# Patient Record
Sex: Female | Born: 1940 | Race: White | Hispanic: No | Marital: Married | Smoking: Never smoker
Health system: Southern US, Community
[De-identification: ages and names within clinical notes are randomized; demographics above are authoritative.]

## PROBLEM LIST (undated history)

## (undated) DIAGNOSIS — E871 Hypo-osmolality and hyponatremia: Secondary | ICD-10-CM

## (undated) DIAGNOSIS — G4730 Sleep apnea, unspecified: Secondary | ICD-10-CM

## (undated) DIAGNOSIS — T753XXA Motion sickness, initial encounter: Secondary | ICD-10-CM

## (undated) DIAGNOSIS — M436 Torticollis: Secondary | ICD-10-CM

## (undated) DIAGNOSIS — F329 Major depressive disorder, single episode, unspecified: Secondary | ICD-10-CM

## (undated) DIAGNOSIS — M5030 Other cervical disc degeneration, unspecified cervical region: Secondary | ICD-10-CM

## (undated) DIAGNOSIS — F419 Anxiety disorder, unspecified: Secondary | ICD-10-CM

## (undated) DIAGNOSIS — I10 Essential (primary) hypertension: Secondary | ICD-10-CM

## (undated) DIAGNOSIS — Z974 Presence of external hearing-aid: Secondary | ICD-10-CM

## (undated) DIAGNOSIS — F32A Depression, unspecified: Secondary | ICD-10-CM

## (undated) DIAGNOSIS — R51 Headache: Secondary | ICD-10-CM

## (undated) DIAGNOSIS — K219 Gastro-esophageal reflux disease without esophagitis: Secondary | ICD-10-CM

## (undated) DIAGNOSIS — E119 Type 2 diabetes mellitus without complications: Secondary | ICD-10-CM

## (undated) DIAGNOSIS — R519 Headache, unspecified: Secondary | ICD-10-CM

## (undated) DIAGNOSIS — M1990 Unspecified osteoarthritis, unspecified site: Secondary | ICD-10-CM

## (undated) HISTORY — PX: SHOULDER SURGERY: SHX246

## (undated) HISTORY — PX: REPLACEMENT TOTAL KNEE BILATERAL: SUR1225

## (undated) HISTORY — PX: CHOLECYSTECTOMY: SHX55

## (undated) HISTORY — PX: REVISION TOTAL KNEE ARTHROPLASTY: SUR1280

## (undated) HISTORY — PX: CARPAL TUNNEL RELEASE: SHX101

## (undated) HISTORY — PX: TUBAL LIGATION: SHX77

---

## 1993-11-28 HISTORY — PX: NISSEN FUNDOPLICATION: SHX2091

## 2004-12-31 ENCOUNTER — Ambulatory Visit: Payer: Self-pay | Admitting: Unknown Physician Specialty

## 2005-01-13 ENCOUNTER — Ambulatory Visit: Payer: Self-pay | Admitting: Unknown Physician Specialty

## 2005-05-06 ENCOUNTER — Ambulatory Visit: Payer: Self-pay

## 2005-08-23 ENCOUNTER — Ambulatory Visit: Payer: Self-pay | Admitting: Unknown Physician Specialty

## 2006-03-30 ENCOUNTER — Ambulatory Visit: Payer: Self-pay | Admitting: Unknown Physician Specialty

## 2006-08-22 ENCOUNTER — Ambulatory Visit: Payer: Self-pay | Admitting: Unknown Physician Specialty

## 2006-10-13 ENCOUNTER — Ambulatory Visit: Payer: Self-pay | Admitting: Unknown Physician Specialty

## 2007-04-05 ENCOUNTER — Ambulatory Visit: Payer: Self-pay | Admitting: Unknown Physician Specialty

## 2008-01-03 ENCOUNTER — Ambulatory Visit: Payer: Self-pay | Admitting: Unknown Physician Specialty

## 2008-01-27 ENCOUNTER — Ambulatory Visit: Payer: Self-pay | Admitting: Unknown Physician Specialty

## 2008-02-27 ENCOUNTER — Ambulatory Visit: Payer: Self-pay | Admitting: Unknown Physician Specialty

## 2008-04-23 ENCOUNTER — Ambulatory Visit: Payer: Self-pay | Admitting: Ophthalmology

## 2008-07-04 ENCOUNTER — Ambulatory Visit: Payer: Self-pay | Admitting: Unknown Physician Specialty

## 2009-01-14 ENCOUNTER — Ambulatory Visit: Payer: Self-pay

## 2009-06-29 ENCOUNTER — Ambulatory Visit: Payer: Self-pay | Admitting: Unknown Physician Specialty

## 2009-10-08 ENCOUNTER — Ambulatory Visit: Payer: Self-pay | Admitting: Unknown Physician Specialty

## 2010-03-09 ENCOUNTER — Ambulatory Visit: Payer: Self-pay | Admitting: Unknown Physician Specialty

## 2010-03-24 ENCOUNTER — Ambulatory Visit: Payer: Self-pay | Admitting: Unknown Physician Specialty

## 2010-11-04 ENCOUNTER — Ambulatory Visit: Payer: Self-pay | Admitting: Unknown Physician Specialty

## 2011-01-10 ENCOUNTER — Inpatient Hospital Stay: Payer: Self-pay | Admitting: Internal Medicine

## 2011-01-21 ENCOUNTER — Ambulatory Visit: Payer: Self-pay | Admitting: Unknown Physician Specialty

## 2011-05-31 ENCOUNTER — Ambulatory Visit: Payer: Self-pay | Admitting: Unknown Physician Specialty

## 2011-09-13 ENCOUNTER — Emergency Department: Payer: Self-pay | Admitting: Emergency Medicine

## 2012-01-11 ENCOUNTER — Ambulatory Visit: Payer: Self-pay | Admitting: Unknown Physician Specialty

## 2012-02-15 ENCOUNTER — Ambulatory Visit: Payer: Self-pay | Admitting: Internal Medicine

## 2012-03-05 ENCOUNTER — Ambulatory Visit: Payer: Self-pay | Admitting: Unknown Physician Specialty

## 2012-10-10 ENCOUNTER — Ambulatory Visit: Payer: Self-pay | Admitting: Internal Medicine

## 2013-02-07 ENCOUNTER — Ambulatory Visit: Payer: Self-pay | Admitting: Internal Medicine

## 2014-03-11 ENCOUNTER — Ambulatory Visit: Payer: Self-pay | Admitting: Internal Medicine

## 2015-04-25 ENCOUNTER — Encounter: Payer: Self-pay | Admitting: Emergency Medicine

## 2015-04-25 ENCOUNTER — Emergency Department
Admission: EM | Admit: 2015-04-25 | Discharge: 2015-04-25 | Disposition: A | Discharge status: Home / Self Care | Payer: Medicare Other | Attending: Emergency Medicine | Admitting: Emergency Medicine

## 2015-04-25 ENCOUNTER — Other Ambulatory Visit: Payer: Self-pay

## 2015-04-25 DIAGNOSIS — R5383 Other fatigue: Secondary | ICD-10-CM | POA: Diagnosis not present

## 2015-04-25 DIAGNOSIS — R531 Weakness: Secondary | ICD-10-CM | POA: Diagnosis not present

## 2015-04-25 DIAGNOSIS — R51 Headache: Secondary | ICD-10-CM | POA: Insufficient documentation

## 2015-04-25 DIAGNOSIS — R197 Diarrhea, unspecified: Secondary | ICD-10-CM | POA: Diagnosis not present

## 2015-04-25 DIAGNOSIS — I10 Essential (primary) hypertension: Secondary | ICD-10-CM | POA: Insufficient documentation

## 2015-04-25 DIAGNOSIS — Z880 Allergy status to penicillin: Secondary | ICD-10-CM | POA: Diagnosis not present

## 2015-04-25 DIAGNOSIS — R42 Dizziness and giddiness: Secondary | ICD-10-CM | POA: Diagnosis present

## 2015-04-25 DIAGNOSIS — E119 Type 2 diabetes mellitus without complications: Secondary | ICD-10-CM | POA: Insufficient documentation

## 2015-04-25 HISTORY — DX: Sleep apnea, unspecified: G47.30

## 2015-04-25 HISTORY — DX: Type 2 diabetes mellitus without complications: E11.9

## 2015-04-25 HISTORY — DX: Essential (primary) hypertension: I10

## 2015-04-25 HISTORY — DX: Unspecified osteoarthritis, unspecified site: M19.90

## 2015-04-25 LAB — CBC WITH DIFFERENTIAL/PLATELET
BASOS ABS: 0.1 10*3/uL (ref 0–0.1)
BASOS PCT: 1 %
EOS ABS: 0.7 10*3/uL (ref 0–0.7)
Eosinophils Relative: 9 %
HCT: 39 % (ref 35.0–47.0)
HEMOGLOBIN: 12.6 g/dL (ref 12.0–16.0)
LYMPHS ABS: 1.3 10*3/uL (ref 1.0–3.6)
Lymphocytes Relative: 16 %
MCH: 26.7 pg (ref 26.0–34.0)
MCHC: 32.2 g/dL (ref 32.0–36.0)
MCV: 82.9 fL (ref 80.0–100.0)
MONO ABS: 0.8 10*3/uL (ref 0.2–0.9)
Monocytes Relative: 10 %
NEUTROS PCT: 64 %
Neutro Abs: 5.2 10*3/uL (ref 1.4–6.5)
Platelets: 274 10*3/uL (ref 150–440)
RBC: 4.71 MIL/uL (ref 3.80–5.20)
RDW: 16.5 % — AB (ref 11.5–14.5)
WBC: 8.2 10*3/uL (ref 3.6–11.0)

## 2015-04-25 LAB — COMPREHENSIVE METABOLIC PANEL
ALT: 34 U/L (ref 14–54)
AST: 28 U/L (ref 15–41)
Albumin: 3.2 g/dL — ABNORMAL LOW (ref 3.5–5.0)
Alkaline Phosphatase: 57 U/L (ref 38–126)
Anion gap: 11 (ref 5–15)
BILIRUBIN TOTAL: 0.4 mg/dL (ref 0.3–1.2)
BUN: 20 mg/dL (ref 6–20)
CO2: 24 mmol/L (ref 22–32)
CREATININE: 0.66 mg/dL (ref 0.44–1.00)
Calcium: 9.1 mg/dL (ref 8.9–10.3)
Chloride: 103 mmol/L (ref 101–111)
GFR calc non Af Amer: >60 mL/min (ref >60)
GLUCOSE: 180 mg/dL — AB (ref 65–99)
Potassium: 4.2 mmol/L (ref 3.5–5.1)
SODIUM: 138 mmol/L (ref 135–145)
Total Protein: 6.5 g/dL (ref 6.5–8.1)

## 2015-04-25 LAB — TROPONIN I

## 2015-04-25 MED ORDER — SODIUM CHLORIDE 0.9 % IV BOLUS (SEPSIS)
1000.0000 mL | Freq: Once | INTRAVENOUS | Status: AC
  Administered 2015-04-25: 1000 mL via INTRAVENOUS

## 2015-04-25 NOTE — ED Notes (Signed)
Pt resting, lights off, no complaints at this time.

## 2015-04-25 NOTE — ED Provider Notes (Signed)
[facility] Emergency Department Provider Note  Time seen: 9:51 AM  I have reviewed the triage vital signs and the nursing notes.   HISTORY  Chief Complaint Dizziness and Headache    HPI Allison Blackburn is a 74 y.o. female with a past medical history of high blood pressure, diabetes, arthritis presents the emergency department for weakness/fatigue. According to the patient for the past 4 days she has felt increased weakness, fatigue, and intermittent headaches. Patient states 4 days ago prior to symptom onset she had a steroidal injection in her wrists for arthritis. She states since that time her blood sugars have been elevated as high as 375 (which is very abnormal for the patient), and she has been having intermittent headaches. Patient denies any focal weakness/numbness, slurred speech, or confusion. Patient denies any chest pain or shortness of breath. Denies abdominal pain, vomiting, dysuria. Patient did note this morning had small amount of diarrhea which she states is not too atypical. Patient denies any pain, but does state a mild headache at this time.    Past Medical History  Diagnosis Date  . Hypertension   . Diabetes mellitus without complication   . Arthritis   . Sleep apnea     There are no active problems to display for this patient.   Past Surgical History  Procedure Laterality Date  . Cholecystectomy    . Carpal tunnel release    . Replacement total knee bilateral      No current outpatient prescriptions on file.  Allergies Levaquin and Penicillins  No family history on file.  Social History History  Substance Use Topics  . Smoking status: Never Smoker   . Smokeless tobacco: Not on file  . Alcohol Use: Not on file    Review of Systems Constitutional: Negative for fever. Eyes: Negative for visual changes. Cardiovascular: Negative for chest pain. Respiratory: Negative for shortness of breath. Gastrointestinal: Negative for  abdominal pain, vomiting. Mild diarrhea this morning. Genitourinary: Negative for dysuria. Musculoskeletal: Negative for back pain. Neurological: Mild headache. Denies focal weakness or numbness. 10-point ROS otherwise negative.  ____________________________________________   PHYSICAL EXAM:  VITAL SIGNS: ED Triage Vitals  Enc Vitals Group     BP 04/25/15 0858 131/53 mmHg     Pulse Rate 04/25/15 0858 81     Resp 04/25/15 0858 18     Temp 04/25/15 0858 98.5 F (36.9 C)     Temp Source 04/25/15 0858 Oral     SpO2 04/25/15 0858 98 %     Weight 04/25/15 0858 177 lb (80.287 kg)     Height 04/25/15 0858 5\' 2"  (1.575 m)     Head Cir --      Peak Flow --      Pain Score 04/25/15 0909 6     Pain Loc --      Pain Edu? --      Excl. in [place]? --     Constitutional: Alert and oriented. Well appearing and in no distress. Eyes: Normal exam ENT   Head: Normocephalic and atraumatic.   Mouth/Throat: Mucous membranes are moist. Cardiovascular: Normal rate, regular rhythm. No murmurs, rubs, or gallops. Respiratory: Normal respiratory effort without tachypnea nor retractions. Breath sounds are clear  Gastrointestinal: Soft and nontender. No distention.   Musculoskeletal: Nontender with normal range of motion in all extremities. Neurologic:  Normal speech and language. No gross focal neurologic deficits Skin:  Skin is warm, dry and intact.  Psychiatric: Mood and affect are normal.  Speech and behavior are normal.   ____________________________________________    EKG  EKG reviewed and interpreted by myself shows normal sinus rhythm at 79 bpm, narrow QRS, left axis deviation, no concerning ST changes noted.  ____________________________________________   INITIAL IMPRESSION / ASSESSMENT AND PLAN / ED COURSE  Pertinent labs & imaging results that were available during my care of the patient were reviewed by me and considered in my medical decision making (see chart for  details).  74 year old with 3-4 days of weakness and intermittent headaches. We will check labs, IV hydrate, and monitor in the emergency department.   Labs are largely within normal limits. Patient states she feels much better after IV fluids and has used the bathroom several times. We will discharge home with primary care follow-up. Patient is agreeable to this plan. ____________________________________________   FINAL CLINICAL IMPRESSION(S) / ED DIAGNOSES  Headache Weakness   Harvest Dark, MD 04/25/15 1212

## 2015-04-25 NOTE — ED Notes (Signed)
Tuesday had cortisone shot , Wednesday dizziness/ headache /increased CBG , today cbg 144 , pt currently feels symptoms, less energy today.  No extremity weakness noted , no difficulty speaking,

## 2015-04-25 NOTE — Discharge Instructions (Signed)

## 2016-02-02 HISTORY — PX: WRIST SURGERY: SHX841

## 2016-02-11 NOTE — Discharge Instructions (Signed)

## 2016-02-12 ENCOUNTER — Encounter: Payer: Self-pay | Admitting: *Deleted

## 2016-02-15 ENCOUNTER — Other Ambulatory Visit: Payer: Self-pay | Admitting: Internal Medicine

## 2016-02-15 DIAGNOSIS — Z1231 Encounter for screening mammogram for malignant neoplasm of breast: Secondary | ICD-10-CM

## 2016-02-16 NOTE — Anesthesia Preprocedure Evaluation (Addendum)
Anesthesia Evaluation  Patient identified by MRN, date of birth, ID band Patient awake    Airway Mallampati: II  TM Distance: >3 FB Neck ROM: Full    Dental   Pulmonary sleep apnea ,    Pulmonary exam normal        Cardiovascular hypertension, Pt. on medications + angina Normal cardiovascular exam     Neuro/Psych  Headaches, Anxiety    GI/Hepatic GERD  Medicated,  Endo/Other  diabetes  Renal/GU      Musculoskeletal   Abdominal   Peds  Hematology   Anesthesia Other Findings   Reproductive/Obstetrics                            Anesthesia Physical Anesthesia Plan  ASA: III  Anesthesia Plan: MAC   Post-op Pain Management:    Induction: Intravenous  Airway Management Planned:   Additional Equipment:   Intra-op Plan:   Post-operative Plan:   Informed Consent: I have reviewed the patients History and Physical, chart, labs and discussed the procedure including the risks, benefits and alternatives for the proposed anesthesia with the patient or authorized representative who has indicated his/her understanding and acceptance.     Plan Discussed with: CRNA  Anesthesia Plan Comments:         Anesthesia Quick Evaluation

## 2016-02-17 ENCOUNTER — Ambulatory Visit: Payer: Medicare Other | Admitting: Anesthesiology

## 2016-02-17 ENCOUNTER — Encounter
Admission: RE | Disposition: A | Discharge status: Home / Self Care | Payer: Self-pay | Source: Ambulatory Visit | Attending: Ophthalmology

## 2016-02-17 ENCOUNTER — Ambulatory Visit
Admission: RE | Admit: 2016-02-17 | Discharge: 2016-02-17 | Disposition: A | Discharge status: Home / Self Care | Payer: Medicare Other | Source: Ambulatory Visit | Attending: Ophthalmology | Admitting: Ophthalmology

## 2016-02-17 DIAGNOSIS — K219 Gastro-esophageal reflux disease without esophagitis: Secondary | ICD-10-CM | POA: Insufficient documentation

## 2016-02-17 DIAGNOSIS — H2512 Age-related nuclear cataract, left eye: Secondary | ICD-10-CM | POA: Insufficient documentation

## 2016-02-17 DIAGNOSIS — G4730 Sleep apnea, unspecified: Secondary | ICD-10-CM | POA: Insufficient documentation

## 2016-02-17 DIAGNOSIS — M7989 Other specified soft tissue disorders: Secondary | ICD-10-CM | POA: Diagnosis not present

## 2016-02-17 DIAGNOSIS — Z888 Allergy status to other drugs, medicaments and biological substances status: Secondary | ICD-10-CM | POA: Insufficient documentation

## 2016-02-17 DIAGNOSIS — E119 Type 2 diabetes mellitus without complications: Secondary | ICD-10-CM | POA: Diagnosis not present

## 2016-02-17 DIAGNOSIS — Z880 Allergy status to penicillin: Secondary | ICD-10-CM | POA: Diagnosis not present

## 2016-02-17 DIAGNOSIS — Z9049 Acquired absence of other specified parts of digestive tract: Secondary | ICD-10-CM | POA: Diagnosis not present

## 2016-02-17 DIAGNOSIS — F419 Anxiety disorder, unspecified: Secondary | ICD-10-CM | POA: Insufficient documentation

## 2016-02-17 DIAGNOSIS — I10 Essential (primary) hypertension: Secondary | ICD-10-CM | POA: Insufficient documentation

## 2016-02-17 DIAGNOSIS — F329 Major depressive disorder, single episode, unspecified: Secondary | ICD-10-CM | POA: Diagnosis not present

## 2016-02-17 DIAGNOSIS — Z85828 Personal history of other malignant neoplasm of skin: Secondary | ICD-10-CM | POA: Insufficient documentation

## 2016-02-17 DIAGNOSIS — M1990 Unspecified osteoarthritis, unspecified site: Secondary | ICD-10-CM | POA: Insufficient documentation

## 2016-02-17 DIAGNOSIS — K5730 Diverticulosis of large intestine without perforation or abscess without bleeding: Secondary | ICD-10-CM | POA: Insufficient documentation

## 2016-02-17 DIAGNOSIS — R002 Palpitations: Secondary | ICD-10-CM | POA: Insufficient documentation

## 2016-02-17 DIAGNOSIS — H9193 Unspecified hearing loss, bilateral: Secondary | ICD-10-CM | POA: Insufficient documentation

## 2016-02-17 DIAGNOSIS — F410 Panic disorder [episodic paroxysmal anxiety] without agoraphobia: Secondary | ICD-10-CM | POA: Diagnosis not present

## 2016-02-17 DIAGNOSIS — J40 Bronchitis, not specified as acute or chronic: Secondary | ICD-10-CM | POA: Insufficient documentation

## 2016-02-17 DIAGNOSIS — Z859 Personal history of malignant neoplasm, unspecified: Secondary | ICD-10-CM | POA: Insufficient documentation

## 2016-02-17 HISTORY — DX: Motion sickness, initial encounter: T75.3XXA

## 2016-02-17 HISTORY — DX: Gastro-esophageal reflux disease without esophagitis: K21.9

## 2016-02-17 HISTORY — DX: Depression, unspecified: F32.A

## 2016-02-17 HISTORY — PX: CATARACT EXTRACTION W/PHACO: SHX586

## 2016-02-17 HISTORY — DX: Torticollis: M43.6

## 2016-02-17 HISTORY — DX: Other cervical disc degeneration, unspecified cervical region: M50.30

## 2016-02-17 HISTORY — DX: Anxiety disorder, unspecified: F41.9

## 2016-02-17 HISTORY — DX: Major depressive disorder, single episode, unspecified: F32.9

## 2016-02-17 HISTORY — DX: Headache, unspecified: R51.9

## 2016-02-17 HISTORY — DX: Hypo-osmolality and hyponatremia: E87.1

## 2016-02-17 HISTORY — DX: Presence of external hearing-aid: Z97.4

## 2016-02-17 HISTORY — DX: Headache: R51

## 2016-02-17 LAB — GLUCOSE, CAPILLARY: Glucose-Capillary: 138 mg/dL — ABNORMAL HIGH (ref 65–99)

## 2016-02-17 SURGERY — PHACOEMULSIFICATION, CATARACT, WITH IOL INSERTION
Anesthesia: Monitor Anesthesia Care | Site: Eye | Laterality: Left | Wound class: Clean

## 2016-02-17 MED ORDER — BRIMONIDINE TARTRATE 0.2 % OP SOLN
OPHTHALMIC | Status: DC | PRN
  Administered 2016-02-17: 1 [drp] via OPHTHALMIC

## 2016-02-17 MED ORDER — TIMOLOL MALEATE 0.5 % OP SOLN
OPHTHALMIC | Status: DC | PRN
  Administered 2016-02-17: 1 [drp] via OPHTHALMIC

## 2016-02-17 MED ORDER — PHENYLEPHRINE HCL 10 % OP SOLN
1.0000 [drp] | OPHTHALMIC | Status: DC | PRN
Start: 2016-02-17 — End: 2016-02-17
  Administered 2016-02-17 (×3): 1 [drp] via OPHTHALMIC

## 2016-02-17 MED ORDER — MIDAZOLAM HCL 2 MG/2ML IJ SOLN
INTRAMUSCULAR | Status: DC | PRN
  Administered 2016-02-17 (×2): 1 mg via INTRAVENOUS

## 2016-02-17 MED ORDER — TETRACAINE HCL 0.5 % OP SOLN
1.0000 [drp] | OPHTHALMIC | Status: DC | PRN
  Administered 2016-02-17: 1 [drp] via OPHTHALMIC

## 2016-02-17 MED ORDER — BSS IO SOLN
INTRAOCULAR | Status: DC | PRN
  Administered 2016-02-17: 65 mL via OPHTHALMIC

## 2016-02-17 MED ORDER — CEFUROXIME OPHTHALMIC INJECTION 1 MG/0.1 ML
INJECTION | OPHTHALMIC | Status: DC | PRN
  Administered 2016-02-17: 0.1 mL via INTRACAMERAL

## 2016-02-17 MED ORDER — POLYMYXIN B-TRIMETHOPRIM 10000-0.1 UNIT/ML-% OP SOLN
1.0000 [drp] | OPHTHALMIC | Status: DC | PRN
  Administered 2016-02-17 (×3): 1 [drp] via OPHTHALMIC

## 2016-02-17 MED ORDER — POVIDONE-IODINE 5 % OP SOLN
1.0000 "application " | OPHTHALMIC | Status: DC | PRN
  Administered 2016-02-17: 1 via OPHTHALMIC

## 2016-02-17 MED ORDER — FENTANYL CITRATE (PF) 100 MCG/2ML IJ SOLN
INTRAMUSCULAR | Status: DC | PRN
  Administered 2016-02-17: 100 ug via INTRAVENOUS

## 2016-02-17 MED ORDER — LACTATED RINGERS IV SOLN: INTRAVENOUS | Status: DC

## 2016-02-17 MED ORDER — LIDOCAINE HCL 2 % EX GEL
1.0000 | Freq: Once | CUTANEOUS | Status: AC
Start: 2016-02-17 — End: 2016-02-17
  Administered 2016-02-17: 1 via TOPICAL

## 2016-02-17 MED ORDER — NA HYALUR & NA CHOND-NA HYALUR 0.4-0.35 ML IO KIT
PACK | INTRAOCULAR | Status: DC | PRN
  Administered 2016-02-17: 1 mL via INTRAOCULAR

## 2016-02-17 MED ORDER — CYCLOPENTOLATE HCL 2 % OP SOLN
1.0000 [drp] | OPHTHALMIC | Status: DC | PRN
  Administered 2016-02-17 (×4): 1 [drp] via OPHTHALMIC

## 2016-02-17 SURGICAL SUPPLY — 29 items
CANNULA ANT/CHMB 27G (MISCELLANEOUS) ×1 IMPLANT
CANNULA ANT/CHMB 27GA (MISCELLANEOUS) ×2 IMPLANT
CARTRIDGE ABBOTT (MISCELLANEOUS) ×2 IMPLANT
GLOVE SURG LX 7.5 STRW (GLOVE) ×1
GLOVE SURG LX STRL 7.5 STRW (GLOVE) ×1 IMPLANT
GLOVE SURG TRIUMPH 8.0 PF LTX (GLOVE) ×2 IMPLANT
GOWN STRL REUS W/ TWL LRG LVL3 (GOWN DISPOSABLE) ×2 IMPLANT
GOWN STRL REUS W/TWL LRG LVL3 (GOWN DISPOSABLE) ×4
LENS IOL ACRSF IQ PC 21.0 (Intraocular Lens) IMPLANT
LENS IOL ACRYSOF IQ POST 21.0 (Intraocular Lens) ×2 IMPLANT
MARKER SKIN DUAL TIP RULER LAB (MISCELLANEOUS) ×2 IMPLANT
NDL FILTER BLUNT 18X1 1/2 (NEEDLE) ×1 IMPLANT
NDL RETROBULBAR .5 NSTRL (NEEDLE) IMPLANT
NEEDLE FILTER BLUNT 18X 1/2SAF (NEEDLE) ×1
NEEDLE FILTER BLUNT 18X1 1/2 (NEEDLE) ×1 IMPLANT
PACK CATARACT BRASINGTON (MISCELLANEOUS) ×2 IMPLANT
PACK EYE AFTER SURG (MISCELLANEOUS) ×2 IMPLANT
PACK OPTHALMIC (MISCELLANEOUS) ×2 IMPLANT
RING MALYGIN 7.0 (MISCELLANEOUS) IMPLANT
SUT ETHILON 10-0 CS-B-6CS-B-6 (SUTURE)
SUT VICRYL  9 0 (SUTURE)
SUT VICRYL 9 0 (SUTURE) IMPLANT
SUTURE EHLN 10-0 CS-B-6CS-B-6 (SUTURE) IMPLANT
SYR 3ML LL SCALE MARK (SYRINGE) ×2 IMPLANT
SYR 5ML LL (SYRINGE) IMPLANT
SYR TB 1ML LUER SLIP (SYRINGE) ×2 IMPLANT
WATER STERILE IRR 250ML POUR (IV SOLUTION) ×2 IMPLANT
WATER STERILE IRR 500ML POUR (IV SOLUTION) IMPLANT
WIPE NON LINTING 3.25X3.25 (MISCELLANEOUS) ×2 IMPLANT

## 2016-02-17 NOTE — Op Note (Signed)
OPERATIVE NOTE  MICHAEL SHAMSI [id number] 02/17/2016   PREOPERATIVE DIAGNOSIS:  Nuclear sclerotic cataract left eye. H25.12   POSTOPERATIVE DIAGNOSIS:    Nuclear sclerotic cataract left eye.     PROCEDURE:  Phacoemusification with posterior chamber intraocular lens placement of the left eye   LENS:   Implant Name Type Inv. Item Serial No. Manufacturer Lot No. LRB No. Used  IMPLANT LENS - [id number] Intraocular Lens IMPLANT LENS [id number] ALCON   Left 1     [id number] 21.0 D   ULTRASOUND TIME: 17.5  % of 1 minutes 18 seconds, CDE 13.8  SURGEON:  Wyonia Hough, MD   ANESTHESIA:  Topical with tetracaine drops and 2% Xylocaine jelly.   COMPLICATIONS:  None.   DESCRIPTION OF PROCEDURE:  The patient was identified in the holding room and transported to the operating room and placed in the supine position under the operating microscope.  The left eye was identified as the operative eye and it was prepped and draped in the usual sterile ophthalmic fashion.   A 1 millimeter clear-corneal paracentesis was made at the 1:30 position.  The anterior chamber was filled with Viscoat viscoelastic.  A 2.4 millimeter keratome was used to make a near-clear corneal incision at the 10:30 position.  .  A curvilinear capsulorrhexis was made with a cystotome and capsulorrhexis forceps.  Balanced salt solution was used to hydrodissect and hydrodelineate the nucleus.   Phacoemulsification was then used in stop and chop fashion to remove the lens nucleus and epinucleus.  The remaining cortex was then removed using the irrigation and aspiration handpiece. Provisc was then placed into the capsular bag to distend it for lens placement.  A lens was then injected into the capsular bag.  The remaining viscoelastic was aspirated.   Wounds were hydrated with balanced salt solution.  The anterior chamber was inflated to a physiologic pressure with balanced salt solution.  No wound leaks were noted. Cefuroxime 0.1  ml of a 10mg /ml solution was injected into the anterior chamber for a dose of 1 mg of intracameral antibiotic at the completion of the case.   Timolol and Brimonidine drops were applied to the eye.  The patient was taken to the recovery room in stable condition without complications of anesthesia or surgery.  Reade Trefz 02/17/2016, 8:06 AM

## 2016-02-17 NOTE — Anesthesia Postprocedure Evaluation (Signed)
Anesthesia Post Note  Patient: Allison Blackburn  Procedure(s) Performed: Procedure(s) (LRB): CATARACT EXTRACTION PHACO AND INTRAOCULAR LENS PLACEMENT (IOC) (Left)  Patient location during evaluation: PACU Anesthesia Type: General Level of consciousness: awake and alert Pain management: pain level controlled Vital Signs Assessment: post-procedure vital signs reviewed and stable Respiratory status: spontaneous breathing, nonlabored ventilation, respiratory function stable and patient connected to nasal cannula oxygen Cardiovascular status: blood pressure returned to baseline and stable Postop Assessment: no signs of nausea or vomiting Anesthetic complications: no    Marshell Levan

## 2016-02-17 NOTE — Anesthesia Procedure Notes (Signed)
Procedure Name: MAC Performed by: Kunta Hilleary Pre-anesthesia Checklist: Patient identified, Emergency Drugs available, Suction available, Timeout performed and Patient being monitored Patient Re-evaluated:Patient Re-evaluated prior to inductionOxygen Delivery Method: Nasal cannula Placement Confirmation: positive ETCO2       

## 2016-02-17 NOTE — Transfer of Care (Signed)
Immediate Anesthesia Transfer of Care Note  Patient: Allison Blackburn  Procedure(s) Performed: Procedure(s) with comments: CATARACT EXTRACTION PHACO AND INTRAOCULAR LENS PLACEMENT (IOC) (Left) - DIABETIC - oral meds CPAP  Patient Location: PACU  Anesthesia Type: MAC  Level of Consciousness: awake, alert  and patient cooperative  Airway and Oxygen Therapy: Patient Spontanous Breathing and Patient connected to supplemental oxygen  Post-op Assessment: Post-op Vital signs reviewed, Patient's Cardiovascular Status Stable, Respiratory Function Stable, Patent Airway and No signs of Nausea or vomiting  Post-op Vital Signs: Reviewed and stable  Complications: No apparent anesthesia complications

## 2016-02-17 NOTE — H&P (Signed)
  The History and Physical notes are on paper, have been signed, and are to be scanned. The patient remains stable and unchanged from the H&P.   Previous H&P reviewed, patient examined, and there are no changes.  Mickeal Daws 02/17/2016 7:37 AM

## 2016-02-18 ENCOUNTER — Encounter: Payer: Self-pay | Admitting: Ophthalmology

## 2016-03-03 ENCOUNTER — Encounter: Payer: Self-pay | Admitting: *Deleted

## 2016-03-07 NOTE — Discharge Instructions (Signed)

## 2016-03-10 ENCOUNTER — Ambulatory Visit: Payer: Medicare Other

## 2016-03-16 ENCOUNTER — Ambulatory Visit: Payer: Medicare Other

## 2016-03-17 ENCOUNTER — Ambulatory Visit
Admission: RE | Admit: 2016-03-17 | Discharge: 2016-03-17 | Disposition: A | Discharge status: Home / Self Care | Payer: Medicare Other | Source: Ambulatory Visit | Attending: Internal Medicine | Admitting: Internal Medicine

## 2016-03-17 ENCOUNTER — Other Ambulatory Visit: Payer: Self-pay | Admitting: Internal Medicine

## 2016-03-17 DIAGNOSIS — Z1231 Encounter for screening mammogram for malignant neoplasm of breast: Secondary | ICD-10-CM | POA: Diagnosis not present

## 2016-04-06 ENCOUNTER — Ambulatory Visit
Admission: RE | Admit: 2016-04-06 | Discharge: 2016-04-06 | Disposition: A | Discharge status: Home / Self Care | Payer: Medicare Other | Source: Ambulatory Visit | Attending: Ophthalmology | Admitting: Ophthalmology

## 2016-04-06 ENCOUNTER — Encounter
Admission: RE | Disposition: A | Discharge status: Home / Self Care | Payer: Self-pay | Source: Ambulatory Visit | Attending: Ophthalmology

## 2016-04-06 ENCOUNTER — Ambulatory Visit: Payer: Medicare Other | Admitting: Anesthesiology

## 2016-04-06 DIAGNOSIS — F419 Anxiety disorder, unspecified: Secondary | ICD-10-CM | POA: Insufficient documentation

## 2016-04-06 DIAGNOSIS — Z888 Allergy status to other drugs, medicaments and biological substances status: Secondary | ICD-10-CM | POA: Insufficient documentation

## 2016-04-06 DIAGNOSIS — H9193 Unspecified hearing loss, bilateral: Secondary | ICD-10-CM | POA: Insufficient documentation

## 2016-04-06 DIAGNOSIS — G4730 Sleep apnea, unspecified: Secondary | ICD-10-CM | POA: Diagnosis not present

## 2016-04-06 DIAGNOSIS — M1990 Unspecified osteoarthritis, unspecified site: Secondary | ICD-10-CM | POA: Diagnosis not present

## 2016-04-06 DIAGNOSIS — K5790 Diverticulosis of intestine, part unspecified, without perforation or abscess without bleeding: Secondary | ICD-10-CM | POA: Insufficient documentation

## 2016-04-06 DIAGNOSIS — E119 Type 2 diabetes mellitus without complications: Secondary | ICD-10-CM | POA: Insufficient documentation

## 2016-04-06 DIAGNOSIS — Z9049 Acquired absence of other specified parts of digestive tract: Secondary | ICD-10-CM | POA: Insufficient documentation

## 2016-04-06 DIAGNOSIS — K219 Gastro-esophageal reflux disease without esophagitis: Secondary | ICD-10-CM | POA: Diagnosis not present

## 2016-04-06 DIAGNOSIS — E7800 Pure hypercholesterolemia, unspecified: Secondary | ICD-10-CM | POA: Diagnosis not present

## 2016-04-06 DIAGNOSIS — Z96653 Presence of artificial knee joint, bilateral: Secondary | ICD-10-CM | POA: Insufficient documentation

## 2016-04-06 DIAGNOSIS — M810 Age-related osteoporosis without current pathological fracture: Secondary | ICD-10-CM | POA: Diagnosis not present

## 2016-04-06 DIAGNOSIS — Z9841 Cataract extraction status, right eye: Secondary | ICD-10-CM | POA: Insufficient documentation

## 2016-04-06 DIAGNOSIS — H2511 Age-related nuclear cataract, right eye: Secondary | ICD-10-CM | POA: Diagnosis not present

## 2016-04-06 DIAGNOSIS — Z85828 Personal history of other malignant neoplasm of skin: Secondary | ICD-10-CM | POA: Diagnosis not present

## 2016-04-06 DIAGNOSIS — I10 Essential (primary) hypertension: Secondary | ICD-10-CM | POA: Diagnosis not present

## 2016-04-06 HISTORY — PX: CATARACT EXTRACTION W/PHACO: SHX586

## 2016-04-06 LAB — GLUCOSE, CAPILLARY
GLUCOSE-CAPILLARY: 158 mg/dL — AB (ref 65–99)
Glucose-Capillary: 163 mg/dL — ABNORMAL HIGH (ref 65–99)

## 2016-04-06 SURGERY — PHACOEMULSIFICATION, CATARACT, WITH IOL INSERTION
Anesthesia: Monitor Anesthesia Care | Laterality: Right | Wound class: Clean

## 2016-04-06 MED ORDER — NA HYALUR & NA CHOND-NA HYALUR 0.4-0.35 ML IO KIT
PACK | INTRAOCULAR | Status: DC | PRN
  Administered 2016-04-06: 1 mL via INTRAOCULAR

## 2016-04-06 MED ORDER — POLYMYXIN B-TRIMETHOPRIM 10000-0.1 UNIT/ML-% OP SOLN
1.0000 [drp] | OPHTHALMIC | Status: DC | PRN
Start: 2016-04-06 — End: 2016-04-06
  Administered 2016-04-06 (×3): 1 [drp] via OPHTHALMIC

## 2016-04-06 MED ORDER — EPINEPHRINE HCL 1 MG/ML IJ SOLN
INTRAMUSCULAR | Status: DC | PRN
  Administered 2016-04-06: 69 mL via OPHTHALMIC

## 2016-04-06 MED ORDER — CYCLOPENTOLATE HCL 2 % OP SOLN
1.0000 [drp] | OPHTHALMIC | Status: DC | PRN
  Administered 2016-04-06 (×3): 1 [drp] via OPHTHALMIC

## 2016-04-06 MED ORDER — MIDAZOLAM HCL 2 MG/2ML IJ SOLN
INTRAMUSCULAR | Status: DC | PRN
  Administered 2016-04-06: 2 mg via INTRAVENOUS

## 2016-04-06 MED ORDER — BRIMONIDINE TARTRATE 0.2 % OP SOLN
OPHTHALMIC | Status: DC | PRN
  Administered 2016-04-06: 1 [drp] via OPHTHALMIC

## 2016-04-06 MED ORDER — TIMOLOL MALEATE 0.5 % OP SOLN
OPHTHALMIC | Status: DC | PRN
  Administered 2016-04-06: 1 [drp] via OPHTHALMIC

## 2016-04-06 MED ORDER — LIDOCAINE HCL (PF) 4 % IJ SOLN
INTRAOCULAR | Status: DC | PRN
  Administered 2016-04-06: 1 mL via OPHTHALMIC

## 2016-04-06 MED ORDER — TETRACAINE HCL 0.5 % OP SOLN
1.0000 [drp] | OPHTHALMIC | Status: DC | PRN
  Administered 2016-04-06: 1 [drp] via OPHTHALMIC

## 2016-04-06 MED ORDER — PHENYLEPHRINE HCL 10 % OP SOLN
1.0000 [drp] | OPHTHALMIC | Status: DC | PRN
  Administered 2016-04-06 (×3): 1 [drp] via OPHTHALMIC

## 2016-04-06 MED ORDER — FENTANYL CITRATE (PF) 100 MCG/2ML IJ SOLN
INTRAMUSCULAR | Status: DC | PRN
  Administered 2016-04-06 (×2): 50 ug via INTRAVENOUS

## 2016-04-06 MED ORDER — LIDOCAINE HCL 2 % EX GEL
1.0000 "application " | Freq: Once | CUTANEOUS | Status: AC
  Administered 2016-04-06: 1 via TOPICAL

## 2016-04-06 MED ORDER — CEFUROXIME OPHTHALMIC INJECTION 1 MG/0.1 ML
INJECTION | OPHTHALMIC | Status: DC | PRN
  Administered 2016-04-06: 0.1 mL via INTRACAMERAL

## 2016-04-06 MED ORDER — POVIDONE-IODINE 5 % OP SOLN
1.0000 "application " | OPHTHALMIC | Status: DC | PRN
  Administered 2016-04-06: 1 via OPHTHALMIC

## 2016-04-06 SURGICAL SUPPLY — 22 items
CANNULA ANT/CHMB 27G (MISCELLANEOUS) ×1 IMPLANT
CANNULA ANT/CHMB 27GA (MISCELLANEOUS) ×2 IMPLANT
CARTRIDGE ABBOTT (MISCELLANEOUS) IMPLANT
GLOVE SURG LX 7.5 STRW (GLOVE) ×1
GLOVE SURG LX STRL 7.5 STRW (GLOVE) ×1 IMPLANT
GLOVE SURG TRIUMPH 8.0 PF LTX (GLOVE) ×2 IMPLANT
GOWN STRL REUS W/ TWL LRG LVL3 (GOWN DISPOSABLE) ×2 IMPLANT
GOWN STRL REUS W/TWL LRG LVL3 (GOWN DISPOSABLE) ×4
LENS IOL ACRYSOF IQ 20.0 (Intraocular Lens) ×1 IMPLANT
MARKER SKIN DUAL TIP RULER LAB (MISCELLANEOUS) ×2 IMPLANT
NDL RETROBULBAR .5 NSTRL (NEEDLE) IMPLANT
PACK CATARACT BRASINGTON (MISCELLANEOUS) ×2 IMPLANT
PACK EYE AFTER SURG (MISCELLANEOUS) ×2 IMPLANT
PACK OPTHALMIC (MISCELLANEOUS) ×2 IMPLANT
RING MALYGIN 7.0 (MISCELLANEOUS) IMPLANT
SUT ETHILON 10-0 CS-B-6CS-B-6 (SUTURE)
SUT VICRYL  9 0 (SUTURE)
SUT VICRYL 9 0 (SUTURE) IMPLANT
SUTURE EHLN 10-0 CS-B-6CS-B-6 (SUTURE) IMPLANT
SYR TB 1ML LUER SLIP (SYRINGE) ×2 IMPLANT
WATER STERILE IRR 250ML POUR (IV SOLUTION) ×2 IMPLANT
WIPE NON LINTING 3.25X3.25 (MISCELLANEOUS) ×2 IMPLANT

## 2016-04-06 NOTE — Anesthesia Postprocedure Evaluation (Signed)
Anesthesia Post Note  Patient: Allison Blackburn  Procedure(s) Performed: Procedure(s) (LRB): CATARACT EXTRACTION PHACO AND INTRAOCULAR LENS PLACEMENT (IOC) right eye (Right)  Patient location during evaluation: PACU Anesthesia Type: MAC Level of consciousness: awake and alert Pain management: pain level controlled Vital Signs Assessment: post-procedure vital signs reviewed and stable Respiratory status: spontaneous breathing, nonlabored ventilation, respiratory function stable and patient connected to nasal cannula oxygen Cardiovascular status: blood pressure returned to baseline and stable Postop Assessment: no signs of nausea or vomiting Anesthetic complications: no    Alisa Graff

## 2016-04-06 NOTE — Anesthesia Preprocedure Evaluation (Signed)
Anesthesia Evaluation  Patient identified by MRN, date of birth, ID band Patient awake    Reviewed: Allergy & Precautions, H&P , NPO status , Patient's Chart, lab work & pertinent test results, reviewed documented beta blocker date and time   Airway Mallampati: II  TM Distance: >3 FB Neck ROM: full    Dental no notable dental hx.    Pulmonary sleep apnea ,    Pulmonary exam normal breath sounds clear to auscultation       Cardiovascular Exercise Tolerance: Good hypertension,  Rhythm:regular Rate:Normal     Neuro/Psych  Headaches, negative psych ROS   GI/Hepatic Neg liver ROS, GERD  ,  Endo/Other  negative endocrine ROSdiabetes  Renal/GU negative Renal ROS  negative genitourinary   Musculoskeletal   Abdominal   Peds  Hematology negative hematology ROS (+)   Anesthesia Other Findings   Reproductive/Obstetrics negative OB ROS                             Anesthesia Physical Anesthesia Plan  ASA: III  Anesthesia Plan: MAC   Post-op Pain Management:    Induction:   Airway Management Planned:   Additional Equipment:   Intra-op Plan:   Post-operative Plan:   Informed Consent: I have reviewed the patients History and Physical, chart, labs and discussed the procedure including the risks, benefits and alternatives for the proposed anesthesia with the patient or authorized representative who has indicated his/her understanding and acceptance.   Dental Advisory Given  Plan Discussed with: CRNA  Anesthesia Plan Comments:         Anesthesia Quick Evaluation

## 2016-04-06 NOTE — H&P (Signed)
  The History and Physical notes are on paper, have been signed, and are to be scanned. The patient remains stable and unchanged from the H&P.   Previous H&P reviewed, patient examined, and there are no changes.  Allison Blackburn 04/06/2016 7:40 AM

## 2016-04-06 NOTE — Anesthesia Procedure Notes (Signed)
Procedure Name: MAC Performed by: Kaiana Marion Pre-anesthesia Checklist: Patient identified, Emergency Drugs available, Suction available, Timeout performed and Patient being monitored Patient Re-evaluated:Patient Re-evaluated prior to inductionOxygen Delivery Method: Nasal cannula Placement Confirmation: positive ETCO2     

## 2016-04-06 NOTE — Transfer of Care (Signed)
Immediate Anesthesia Transfer of Care Note  Patient: Allison Blackburn  Procedure(s) Performed: Procedure(s) with comments: CATARACT EXTRACTION PHACO AND INTRAOCULAR LENS PLACEMENT (IOC) right eye (Right) - DIABETIC - oral meds CPAP  Patient Location: PACU  Anesthesia Type: MAC  Level of Consciousness: awake, alert  and patient cooperative  Airway and Oxygen Therapy: Patient Spontanous Breathing and Patient connected to supplemental oxygen  Post-op Assessment: Post-op Vital signs reviewed, Patient's Cardiovascular Status Stable, Respiratory Function Stable, Patent Airway and No signs of Nausea or vomiting  Post-op Vital Signs: Reviewed and stable  Complications: No apparent anesthesia complications

## 2016-04-06 NOTE — Op Note (Signed)
LOCATION:  [facility]   PREOPERATIVE DIAGNOSIS:    Nuclear sclerotic cataract right eye. H25.11   POSTOPERATIVE DIAGNOSIS:  Nuclear sclerotic cataract right eye.     PROCEDURE:  Phacoemusification with posterior chamber intraocular lens placement of the right eye   LENS:   Implant Name Type Inv. Item Serial No. Manufacturer Lot No. LRB No. Used  LENS IOL ACRYSOF IQ 20.0 - [id number] Intraocular Lens LENS IOL ACRYSOF IQ 20.0 [id number] [employer]CON   Right 1        ULTRASOUND TIME: 17 % of 1 minutes, 10 seconds.  CDE 12.1   SURGEON:  Wyonia Hough, MD   ANESTHESIA:  Topical with tetracaine drops and 2% Xylocaine jelly, augmented with 1% preservative-free intracameral lidocaine.    COMPLICATIONS:  None.   DESCRIPTION OF PROCEDURE:  The patient was identified in the holding room and transported to the operating room and placed in the supine position under the operating microscope.  The right eye was identified as the operative eye and it was prepped and draped in the usual sterile ophthalmic fashion.   A 1 millimeter clear-corneal paracentesis was made at the 12:00 position.  0.5 ml of preservative-free 1% lidocaine was injected into the anterior chamber. The anterior chamber was filled with Viscoat viscoelastic.  A 2.4 millimeter keratome was used to make a near-clear corneal incision at the 9:00 position.  A curvilinear capsulorrhexis was made with a cystotome and capsulorrhexis forceps.  Balanced salt solution was used to hydrodissect and hydrodelineate the nucleus.   Phacoemulsification was then used in stop and chop fashion to remove the lens nucleus and epinucleus.  The remaining cortex was then removed using the irrigation and aspiration handpiece. Provisc was then placed into the capsular bag to distend it for lens placement.  A lens was then injected into the capsular bag.  The remaining viscoelastic was aspirated.   Wounds were hydrated with balanced salt solution.   The anterior chamber was inflated to a physiologic pressure with balanced salt solution.  No wound leaks were noted. Cefuroxime 0.1 ml of a 10mg /ml solution was injected into the anterior chamber for a dose of 1 mg of intracameral antibiotic at the completion of the case.   Timolol and Brimonidine drops were applied to the eye.  The patient was taken to the recovery room in stable condition without complications of anesthesia or surgery.   Juliann Olesky 04/06/2016, 8:11 AM

## 2016-04-07 ENCOUNTER — Encounter: Payer: Self-pay | Admitting: Ophthalmology

## 2016-04-15 ENCOUNTER — Encounter: Payer: Medicare Other | Admitting: Physical Therapy

## 2016-04-18 ENCOUNTER — Telehealth: Payer: Self-pay | Admitting: Neurology

## 2016-04-18 ENCOUNTER — Ambulatory Visit: Payer: Medicare Other | Admitting: Neurology

## 2016-04-18 NOTE — Telephone Encounter (Signed)
Patient called my cell phone. No idea how she got it. Said she needed an appointment with me and our phone lines were not working. Can you call patient and find out what is going on? Doesn't appear I have ever seen her before, she is a new referral. Also let her know not to use my cell phone, that is my personal phone line and I do not check it often and may miss something important. Thanks.

## 2016-04-18 NOTE — Telephone Encounter (Signed)
Allison Blackburn- can you look into this? Looks like we were sent a referral back on 04/05/16 but appt got cx? She would be a new patient. Just when the phone start working again, thank you!

## 2016-04-18 NOTE — Telephone Encounter (Signed)
Dr Jaynee Eagles- [facility] Per new pt coordinator: "Called pt and sched for 04/22/16 and pt is aware not to call Dr. Jaynee Eagles cell # for any appointments in the future. I am not sure how she gor that #"

## 2016-04-22 ENCOUNTER — Ambulatory Visit: Payer: Medicare Other | Admitting: Physical Therapy

## 2016-04-22 ENCOUNTER — Ambulatory Visit (INDEPENDENT_AMBULATORY_CARE_PROVIDER_SITE_OTHER): Payer: Medicare Other | Admitting: Neurology

## 2016-04-22 ENCOUNTER — Encounter: Payer: Self-pay | Admitting: Neurology

## 2016-04-22 VITALS — BP 141/69 | HR 84 | Ht 60.5 in | Wt 179.4 lb

## 2016-04-22 DIAGNOSIS — F411 Generalized anxiety disorder: Secondary | ICD-10-CM

## 2016-04-22 DIAGNOSIS — I10 Essential (primary) hypertension: Secondary | ICD-10-CM

## 2016-04-22 DIAGNOSIS — G2581 Restless legs syndrome: Secondary | ICD-10-CM

## 2016-04-22 DIAGNOSIS — F32A Depression, unspecified: Secondary | ICD-10-CM

## 2016-04-22 DIAGNOSIS — R42 Dizziness and giddiness: Secondary | ICD-10-CM | POA: Diagnosis not present

## 2016-04-22 DIAGNOSIS — G4733 Obstructive sleep apnea (adult) (pediatric): Secondary | ICD-10-CM | POA: Diagnosis not present

## 2016-04-22 DIAGNOSIS — F329 Major depressive disorder, single episode, unspecified: Secondary | ICD-10-CM | POA: Diagnosis not present

## 2016-04-22 MED ORDER — PREGABALIN 150 MG PO CAPS: 150.0000 mg | ORAL_CAPSULE | Freq: Every day | ORAL | Status: DC

## 2016-04-22 NOTE — Progress Notes (Signed)
[facility]    Provider:  Dr Jaynee Eagles Referring Provider: Rusty Aus, MD Primary Care Physician:  Rusty Aus, MD  CC:  Dizziness  HPI:  Allison Blackburn is a 75 y.o. female here as a referral from Dr. Sabra Heck for "feeling woozy". PNHx anxiety, depression, HTN, hypersomnia with sleep apnea, obesity, neuropathy, RLS, DM2, migraines. She feels like she is on a rocking boat. She also has symptoms when sitting but not as bad as when walking. Not just when walking. A "woozy" feeling. Hand surgery on March 7th, Cataract surgery on 22nd, started feeling woozy on the 26th and 27th,  She was on hydrocodone for 3 weeks, on the 25th she stopped the hydrocone. She has been feeling woozy since then. She saw her regular doctor the day after Easter and she saw an ENT. Second catract surgery on May 10th. Last Monday she cut back on the mirapex, she thinks she overdosed on it. She feels better. But she still feels "woozy" now but better since decreasing her dopamine agonist for RLS. She is compliant on her cpap. She has had a headache lately. She is under extreme stress because her son has prostate cancer and the marriage is a mess and her grandson is in trouble. Steroids did not help with her symptoms. 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 She is only taking one Klonopin, trazodone and mirapex at night. She is also on Cymbalta. Discussed polypharmacy, stress. No other focal neurologic deficits.   Reviewed notes, labs and imaging from outside physicians, which showed:  Reviewed [facility] visits and records and on April 7th 2017 she was seen and evaluated for her dizziness and diagnosis was "Medicine withdrawal-combination of coming off Klonopin to a lower dose plus discontinuing hydrocodone. With persistent symptoms go back on hydrocodone half tab twice daily 7 days, half tab every morning 7 days, quarter tab 2 AM 7 days then off, maintain Klonopin 2 at bedtime"  Patient had an MRI in the ED  at [facility] in 02/2016 and "MRI/MRA of brain/neck unremarkable."  MRI of the brain 04/2005: reviewed report: Multiplanar images were obtained through the brain both prior to and following administration of gadolinium. The diffusion-weighted sequences exhibit no objective evidence of acute ischemic change. The inversion recovery-FLAIR sequences reveal small amounts of increased periventricular white matter signal anteriorly in the region of the extreme capsule bilaterally. These areas of increased signal have become slightly more conspicuous and increased in number since the prior study. Elsewhere in the deep white matter of both cerebral hemispheres there are several punctate areas of increased white matter signal consistent with chronic small vessel  ischemic-type change. The standard spin-echo images reveal the ventricles to be normal in size and position. Following gadolinium administration, the enhancement pattern of the brain parenchyma is normal. The 7th and 8th cranial nerves appear normal at the level of the cerebellopontine angles. I see no abnormal intra or extraaxial fluid collections on the T2-weighted images. The sella and parasellar structures are normal in appearance.   IMPRESSION:   Since the prior study there has been slight progression of the white matter signal increase in a punctate fashion in both cerebral hemispheres. I do not see evidence of an intracranial mass, hydrocephalus, or an acute ischemic or hemorrhagic event.   I do not see evidence of significant inflammatory change in the visualized portions of the paranasal sinuses or of the mastoid air cells.   Hgba1c 7.6, labs 12/2015 cbc with creatinine 0.7,  triglyceride 375, ldl 45, tsh 2.138  Review of Systems: Patient complains of symptoms per HPI as well as the following symptoms: fatigue, dizziness, depression, anxiety, ringing in ears, aching muscles, fatigue. Pertinent negatives per HPI.  All others negative.   Social History   Social History  . Marital Status: Married    Spouse Name: N/A  . Number of Children: 2  . Years of Education: 20   Occupational History  . Not on file.   Social History Main Topics  . Smoking status: Never Smoker   . Smokeless tobacco: Not on file  . Alcohol Use: No  . Drug Use: Not on file  . Sexual Activity: Not on file   Other Topics Concern  . Not on file   Social History Narrative   Lives with husband.   Caffeine use:  2 cups coffee per day       Family History  Problem Relation Age of Onset  . Stroke Neg Hx   . Seizures Neg Hx   . Neuropathy Neg Hx   . Parkinsonism Neg Hx   . Migraines Neg Hx   . Ataxia Neg Hx   . Dementia Neg Hx     Past Medical History  Diagnosis Date  . Hypertension   . Diabetes mellitus without complication ([place])   . Arthritis   . Sleep apnea     CPAP  . GERD (gastroesophageal reflux disease)     in past.  corrected with [employer].  Marland Kitchen Headache     migraines in past.  now some from C5-C7 disc degeneration  . Stiff neck     degen disc C5-C7.  Limited right/left turn.  OK up/down motion.  . Degenerative disc disease, cervical     C5-C7  . Motion sickness     cars/mountains  . Wears hearing aid   . Depression   . Anxiety   . Sodium (Na) deficiency     had "drip" during knee surg.  Sodium levels very low for extended time after.  OK now    Past Surgical History  Procedure Laterality Date  . Cholecystectomy    . Carpal tunnel release    . Replacement total knee bilateral    . Revision total knee arthroplasty Bilateral     after fall  . Shoulder surgery Right     x2  . Tubal ligation    . Wrist surgery Right 02/02/16    [facility]  . Nissen fundoplication  [id number]  . Cataract extraction w/phaco Left 02/17/2016    Procedure: CATARACT EXTRACTION PHACO AND INTRAOCULAR LENS PLACEMENT (IOC);  Surgeon: Leandrew Koyanagi, MD;  Location: [facility];  Service: Ophthalmology;  Laterality:  Left;  DIABETIC - oral meds CPAP  . Cataract extraction w/phaco Right 04/06/2016    Procedure: CATARACT EXTRACTION PHACO AND INTRAOCULAR LENS PLACEMENT (IOC) right eye;  Surgeon: Leandrew Koyanagi, MD;  Location: [place];  Service: Ophthalmology;  Laterality: Right;  DIABETIC - oral meds CPAP    Current Outpatient Prescriptions  Medication Sig Dispense Refill  . amLODipine (NORVASC) 10 MG tablet Take 10 mg by mouth daily. Reported on 04/22/2016    . aspirin 81 MG tablet Take 81 mg by mouth daily.    Marland Kitchen atorvastatin (LIPITOR) 10 MG tablet Take 10 mg by mouth 3 (three) times a week.    . Calcium Citrate-Vitamin D (CALCIUM CITRATE + PO) Take 2 tablets by mouth daily.    . celecoxib (CELEBREX) 200 MG capsule Take 200 mg  by mouth daily. AM    . Cholecalciferol (VITAMIN D-3) 1000 units CAPS Take by mouth.    . clonazePAM (KLONOPIN) 0.5 MG tablet Take 1 mg by mouth at bedtime.    . DULoxetine (CYMBALTA) 30 MG capsule Take 30 mg by mouth daily.    Marland Kitchen glimepiride (AMARYL) 1 MG tablet Take 1 mg by mouth QID. 1 tab, 3 times daily.  2 tabs at night (if sugar is high).    . losartan (COZAAR) 50 MG tablet Take 50 mg by mouth daily.    . magnesium oxide (MAG-OX) 400 MG tablet Take 400 mg by mouth 2 (two) times daily.    . metFORMIN (GLUCOPHAGE) 850 MG tablet Take 850 mg by mouth 3 (three) times daily.    . metoprolol (LOPRESSOR) 50 MG tablet Take 50 mg by mouth daily.    . Multiple Vitamins-Minerals (CENTRUM SILVER PO) Take 1 tablet by mouth daily.    . Omega-3 Fatty Acids (FISH OIL) 1200 MG CAPS Take by mouth daily. PM    . Potassium Gluconate 2.5 MEQ TABS Take by mouth.    . pramipexole (MIRAPEX) 0.5 MG tablet Take 0.5 mg by mouth 2 (two) times daily. 2 tabs in afternoon, 2 tabs at night    . traZODone (DESYREL) 100 MG tablet Take 100 mg by mouth at bedtime.    . gabapentin (NEURONTIN) 300 MG capsule One capsule at noon, 2 capsules at night 45 capsule 0  . Liraglutide (VICTOZA) 18 MG/3ML  SOPN Inject 7.2 mg into the skin daily. Reported on 04/22/2016     No current facility-administered medications for this visit.    Allergies as of 04/22/2016 - Review Complete 04/22/2016  Allergen Reaction Noted  . Levaquin [levofloxacin in d5w] Other (See Comments) 04/25/2015  . Lyrica [pregabalin]  02/12/2016  . Neurontin [gabapentin]  02/12/2016  . Penicillins Rash 04/25/2015    Vitals: BP 141/69 mmHg  Pulse 84  Ht 5' 0.5" (1.537 m)  Wt 179 lb 6.4 oz (81.375 kg)  BMI 34.45 kg/m2  SpO2 95% Last Weight:  Wt Readings from Last 1 Encounters:  04/22/16 179 lb 6.4 oz (81.375 kg)   Last Height:   Ht Readings from Last 1 Encounters:  04/22/16 5' 0.5" (1.537 m)    Physical exam: Exam: Gen: NAD, conversant, well nourised, obese, well groomed                     CV: RRR, no MRG. No Carotid Bruits. No peripheral edema, warm, nontender Eyes: Conjunctivae clear without exudates or hemorrhage  Neuro: Detailed Neurologic Exam  Speech:    Speech is normal; fluent and spontaneous with normal comprehension.  Cognition:    The patient is oriented to person, place, and time;     recent and remote memory intact;     language fluent;     normal attention, concentration,     fund of knowledge Cranial Nerves:    The pupils are equal, round, and reactive to light. The fundi are normal and spontaneous venous pulsations are present. Visual fields are full to finger confrontation. Extraocular movements are intact. Trigeminal sensation is intact and the muscles of mastication are normal. The face is symmetric. The palate elevates in the midline. Hearing intact. Voice is normal. Shoulder shrug is normal. The tongue has normal motion without fasciculations.   Coordination:    Normal finger to nose and heel to shin.   Gait:    No dysmetria  Motor Observation:  No asymmetry, no atrophy, and no involuntary movements noted. Tone:    Normal muscle tone.    Posture:    Posture is normal.  normal erect    Strength:    Strength is V/V in the upper and lower limbs.      Sensation: intact to LT     Reflex Exam:  DTR's: Absent AJs otherwise deep tendon reflexes in the upper and lower extremities are normal bilaterally.   Toes:    The toes are downgoing bilaterally.   Clonus:    Clonus is absent.      Assessment/Plan:   75 y.o. female here as a referral from Dr. Sabra Heck for "feeling woozy". PMHx anxiety, depression, HTN, hypersomnia with sleep apnea, obesity, neuropathy, RLS, DM2, migraines. She feels like she is on a rocking boat. She also has symptoms when sitting but not as bad as when walking. Evaluated at [facility] in the ED and MRI of the brain unremarkable per review of notes. Orthostatics were nml, no orthostatic hypotension in the office today. Likely medication related. She was taking a high dose of dopamine agonist for RLS by mistake, feels better with reduction.   Remember to drink plenty of fluid, eat healthy meals and do not skip any meals. Try to eat protein with a every meal and eat a healthy snack such as fruit or nuts in between meals. Try to keep a regular sleep-wake schedule and try to exercise daily, particularly in the form of walking, 20-30 minutes a day, if you can.   Dizziness: will decrease Mirapex and try Lyrica instead As far as your medications are concerned, I would like to suggest: Take 1-2 pills Lyrica before nap and before bed. Go down 1/2 pill of Mirapex twice a day for 2-3 days then 1/2 pill at night for 2-3 days.  RLS: As far as diagnostic testing: Lab today, Check Ferritin as it is associated with RLS.   May also consider Vestibular migraines as an etiology  To prevent or relieve headaches, try the following: Cool Compress. Lie down and place a cool compress on your head.  Avoid headache triggers. If certain foods or odors seem to have triggered your migraines in the past, avoid them. A headache diary might help you identify triggers.  Include  physical activity in your daily routine. Try a daily walk or other moderate aerobic exercise.  Manage stress. Find healthy ways to cope with the stressors, such as delegating tasks on your to-do list.  Practice relaxation techniques. Try deep breathing, yoga, massage and visualization.  Eat regularly. Eating regularly scheduled meals and maintaining a healthy diet might help prevent headaches. Also, drink plenty of fluids.  Follow a regular sleep schedule. Sleep deprivation might contribute to headaches Consider biofeedback. With this mind-body technique, you learn to control certain bodily functions - such as muscle tension, heart rate and blood pressure - to prevent headaches or reduce headache pain.    Proceed to emergency room if you experience new or worsening symptoms or symptoms do not resolve, if you have new neurologic symptoms or if headache is severe, or for any concerning symptom.      Sarina Ill, MD  [facility] [address] [place], [place] [address]  Phone [phone] Fax [phone]

## 2016-04-22 NOTE — Patient Instructions (Addendum)
Remember to drink plenty of fluid, eat healthy meals and do not skip any meals. Try to eat protein with a every meal and eat a healthy snack such as fruit or nuts in between meals. Try to keep a regular sleep-wake schedule and try to exercise daily, particularly in the form of walking, 20-30 minutes a day, if you can.   As far as your medications are concerned, I would like to suggest: Take 1-2 pills Lyrica before nap and before bed. Go down 1/2 pill of Mirapex twice a day for 2-3 days then 1/2 pill at night for 2-3 days.  As far as diagnostic testing: Lab today, Check Ferritin as it is associated with RLS. Vestibular migraines.  I would like to see you back if needed, sooner if we need to. Please call us with any interim questions, concerns, problems, updates or refill requests.   Our phone number is [phone]. We also have an after hours call service for urgent matters and there is a physician on-call for urgent questions. For any emergencies you know to call 911 or go to the nearest emergency room

## 2016-04-23 LAB — FERRITIN: FERRITIN: 34 ng/mL (ref 15–150)

## 2016-04-25 ENCOUNTER — Encounter: Payer: Self-pay | Admitting: Neurology

## 2016-04-25 ENCOUNTER — Telehealth: Payer: Self-pay | Admitting: Neurology

## 2016-04-25 DIAGNOSIS — F411 Generalized anxiety disorder: Secondary | ICD-10-CM | POA: Insufficient documentation

## 2016-04-25 DIAGNOSIS — R42 Dizziness and giddiness: Secondary | ICD-10-CM | POA: Insufficient documentation

## 2016-04-25 DIAGNOSIS — G2581 Restless legs syndrome: Secondary | ICD-10-CM | POA: Insufficient documentation

## 2016-04-25 DIAGNOSIS — G4733 Obstructive sleep apnea (adult) (pediatric): Secondary | ICD-10-CM | POA: Insufficient documentation

## 2016-04-25 DIAGNOSIS — G43909 Migraine, unspecified, not intractable, without status migrainosus: Secondary | ICD-10-CM | POA: Insufficient documentation

## 2016-04-25 DIAGNOSIS — I10 Essential (primary) hypertension: Secondary | ICD-10-CM | POA: Insufficient documentation

## 2016-04-25 DIAGNOSIS — E119 Type 2 diabetes mellitus without complications: Secondary | ICD-10-CM | POA: Insufficient documentation

## 2016-04-25 DIAGNOSIS — F329 Major depressive disorder, single episode, unspecified: Secondary | ICD-10-CM | POA: Insufficient documentation

## 2016-04-25 DIAGNOSIS — F32A Depression, unspecified: Secondary | ICD-10-CM | POA: Insufficient documentation

## 2016-04-25 MED ORDER — GABAPENTIN 300 MG PO CAPS: ORAL_CAPSULE | ORAL | Status: DC

## 2016-04-25 NOTE — Telephone Encounter (Signed)
I called the patient. She has been woozy on mirapex, and she was recently tapered off of mirapex and placed on lyrica. She has not slept well. ? Tapered too fast off of mirapex, will change to gabapentin taking 300 mg at noon for her nap and 600 mg at night. She may take 0.5 mirapex at night if needed. May need to taper much more slowly off of the mirapex.  ? Use of IV iron. Ferritin is in the low normal range.  Lyrica and gabapentin are listed as allergies, the patient has tried these medications 10 years ago, and they caused some mental clouding, she does not want to take the Lyrica again, she is willing to try gabapentin again, go on mirapex at 0.5 mg at night for now, and if this does not work, consider IV Iron therapy with a reduced dose of a dopamine agonist drug of some sort.

## 2016-04-27 ENCOUNTER — Telehealth: Payer: Self-pay | Admitting: *Deleted

## 2016-04-27 NOTE — Telephone Encounter (Signed)
Tried calling home number. Phone continued to ring, unable to LVM. Wanted remote access code.  Tried mobile number. LVM for pt to call about results. Gave [facility] phone number.

## 2016-04-27 NOTE — Telephone Encounter (Signed)
-----   Message from Melvenia Beam, MD sent at 04/25/2016  6:49 PM EDT ----- Ferritin level is very low, this is contributing to her restless leg syndrome. Her ferritin is 34, we recommend a minimum of 75 in RLS. We often try IV iron if she is interested, this may significantly help her symptoms. She can also take oral iron daily, this may take come time but it really helps as well. I recommend iron over the counter iron 325mg  twice daily with vitamin C but again we can try to get an IV iron treatment approved if she likes. Let me know thanks.

## 2016-04-28 NOTE — Telephone Encounter (Signed)
Dr Jaynee Eagles- please advise.  Called pt back. Relayed results per Dr Jaynee Eagles note below. Pt agreeable to try IV infusion of iron. Advised we will have to process it through her insurance and see if it is covered first. She states she may be willing to pay out of pocket depending on the cost. Pt states she tried taking lyrica. The first night pt states "it was a 2.5 hour battle. Night two was better." She took both the Mirapex and lyrica. On Sunday, she only took the lyrica and she started jerking. She stopped taking lyrica. She called Monday morning,and spoke to Dr Jannifer Franklin. He advised her to try taking gabapentin and mirapex as needed at night but pt states she is scared to try it. She took mirapex at 12pm and at night on Wednesday and yesterday at night. I advised would talk to Dr Jaynee Eagles and call her back to advise. She verbalized understanding.  I will give her information to [facility] Duty nurse to process  Her information for iron IV infusion.

## 2016-04-28 NOTE — Telephone Encounter (Signed)
Dr Allison Blackburn- FYI  Called and spoke to patient. Relayed Dr Allison Blackburn message below. Patient questioning if she can pay out of pocket for infusion and not go through insurance? Or how long it will take to get insurance to approve infusion. Advised I will have to check with Allison Blackburn in intrafusion about this and have her call her tomorrow. She is already gone for the day. She verbalized understanding.  Pt also stated she is going to try and take 600mg  gabapentin tonight as instructed by Dr Allison Blackburn before. Advised her to call back if she experiences any SE. She verbalized understanding.

## 2016-04-28 NOTE — Telephone Encounter (Signed)
Message For: [facility]                  Taken  1-JUN-17 at 10:21AM by [facility] ------------------------------------------------------------ Serita Kyle [facility]             CID [id number]  Patient SAME                 Pt's Dr Jaynee Eagles        Area Code [id number] DR'S RN; PLEASE C.B                                                                Disp:Y/N N If Y = C/B If No Response In 41minutes ============================================================

## 2016-04-28 NOTE — Telephone Encounter (Signed)
Let's do the IV therapy first and see if her RLS improves, then maybe she won't need the mirapex or anything at all. I can touch base with her when she comes in for infusion thanks

## 2016-04-29 ENCOUNTER — Encounter: Payer: Medicare Other | Admitting: Physical Therapy

## 2016-05-04 ENCOUNTER — Encounter: Payer: Medicare Other | Admitting: Physical Therapy

## 2016-05-12 ENCOUNTER — Ambulatory Visit: Payer: Medicare Other | Attending: Orthopedic Surgery | Admitting: Occupational Therapy

## 2016-05-12 DIAGNOSIS — M25631 Stiffness of right wrist, not elsewhere classified: Secondary | ICD-10-CM | POA: Diagnosis present

## 2016-05-12 DIAGNOSIS — M79641 Pain in right hand: Secondary | ICD-10-CM | POA: Diagnosis not present

## 2016-05-12 DIAGNOSIS — M6281 Muscle weakness (generalized): Secondary | ICD-10-CM | POA: Diagnosis present

## 2016-05-12 DIAGNOSIS — M25531 Pain in right wrist: Secondary | ICD-10-CM | POA: Insufficient documentation

## 2016-05-12 NOTE — Patient Instructions (Signed)
Provided pt with gentle PROM for wrist flexion and extention  AROM for RD and UD  AROM for flexion and extention  8-10 reps  Putty teal for gripping  15 reps  Pain should not be more than 1-2/10  2 x day

## 2016-05-12 NOTE — Therapy (Signed)
[place] [facility] [address], [place], [address] Phone: [phone]   Fax:  [phone]  Occupational Therapy Treatment  Patient Details  Name: Allison Blackburn MRN: [id number] Date of Birth: [birth year] Referring Provider: Jarold Song  Encounter Date: 05/12/2016      OT End of Session - 05/12/16 1739    Visit Number 1   Number of Visits 4   Date for OT Re-Evaluation 06/09/16   OT Start Time 1310   OT Stop Time 1402   OT Time Calculation (min) 52 min   Activity Tolerance Patient tolerated treatment well   Behavior During Therapy [name] for tasks assessed/performed      Past Medical History  Diagnosis Date  . Hypertension   . Diabetes mellitus without complication ([place])   . Arthritis   . Sleep apnea     CPAP  . GERD (gastroesophageal reflux disease)     in past.  corrected with [employer].  Marland Kitchen Headache     migraines in past.  now some from C5-C7 disc degeneration  . Stiff neck     degen disc C5-C7.  Limited right/left turn.  OK up/down motion.  . Degenerative disc disease, cervical     C5-C7  . Motion sickness     cars/mountains  . Wears hearing aid   . Depression   . Anxiety   . Sodium (Na) deficiency     had "drip" during knee surg.  Sodium levels very low for extended time after.  OK now    Past Surgical History  Procedure Laterality Date  . Cholecystectomy    . Carpal tunnel release    . Replacement total knee bilateral    . Revision total knee arthroplasty Bilateral     after fall  . Shoulder surgery Right     x2  . Tubal ligation    . Wrist surgery Right 02/02/16    [facility]  . Nissen fundoplication  [id number]  . Cataract extraction w/phaco Left 02/17/2016    Procedure: CATARACT EXTRACTION PHACO AND INTRAOCULAR LENS PLACEMENT (IOC);  Surgeon: Leandrew Koyanagi, MD;  Location: [place];  Service: Ophthalmology;  Laterality: Left;  DIABETIC - oral meds CPAP  . Cataract extraction w/phaco Right  04/06/2016    Procedure: CATARACT EXTRACTION PHACO AND INTRAOCULAR LENS PLACEMENT (IOC) right eye;  Surgeon: Leandrew Koyanagi, MD;  Location: [place];  Service: Ophthalmology;  Laterality: Right;  DIABETIC - oral meds CPAP    There were no vitals filed for this visit.      Subjective Assessment - 05/12/16 1315    Subjective  Took me while to get to therapy - because of some medical issues - pain still at the wrist about the same - still hard to turn car key , fixing hair , putting on makeup - still wearing splints - hard time make jewelry , and painting    Patient Stated Goals Want to get more flexibillity , and get pain better - wants to write better  , painting    Currently in Pain? Yes   Pain Score 2    Pain Location Wrist   Pain Orientation Right   Pain Descriptors / Indicators Aching   Pain Onset More than a month ago            [facility] OT Assessment - 05/12/16 0001    Assessment   Diagnosis R wrist pain after fusion   Referring Provider Ruch   Onset Date  02/02/16   Home  Environment   Lives With Spouse   Prior Function   Vocation Retired   Leisure R hand dominant - likes to travel , make jewelry and paint   AROM   Right Forearm Supination 70 Degrees   Left Forearm Supination 90 Degrees   Right Wrist Extension 28 Degrees   Right Wrist Flexion 44 Degrees   Right Wrist Radial Deviation 11 Degrees   Right Wrist Ulnar Deviation 8 Degrees   Left Wrist Extension 46 Degrees   Left Wrist Flexion 37 Degrees   Strength   Right Hand Grip (lbs) 21   Right Hand Lateral Pinch 9 lbs   Right Hand 3 Point Pinch 6 lbs   Left Hand Grip (lbs) 35   Left Hand Lateral Pinch 18 lbs   Left Hand 3 Point Pinch 14 lbs      Parafin done prior to review of HEP to decrease pain - pt educated in use of parafin  For HEP  And ed on home program                     OT Education - 05/12/16 1739    Education provided Yes   Education Details HEP   Person(s)  Educated Patient   Methods Explanation;Demonstration;Tactile cues;Verbal cues;Handout   Comprehension Verbal cues required;Returned demonstration;Verbalized understanding          OT Short Term Goals - 05/12/16 1742    OT SHORT TERM GOAL #1   Title Pain on PRHWE improve with 10-15 points    Baseline Pain on PRWHE at eval 36/50   Time 3   Period Weeks   Status New   OT SHORT TERM GOAL #2   Title Grip strength improve with at least 5 lbs to hold glass and drive with more ease    Baseline Grip R 20 and L 35   Time 3   Period Weeks   Status New           OT Long Term Goals - 05/12/16 1744    OT LONG TERM GOAL #1   Title Pt to be Ind in HEP to increase AROM at wrist to increase functional use with painting , fixing hair, do makeup    Baseline Wrist AROM see flowsheet   Time 4   Period Weeks   Status New   OT LONG TERM GOAL #2   Title Pt to  verbalize 3 joint protection and AE to use at home to decrease pain and increase ease in doing ADL's and IADL"S   Baseline in need for info   Time 4   Period Weeks   Status New               Plan - 05/12/16 1739    Clinical Impression Statement Pt present about 3 months from fusion of R wrist - 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 - pt report pain still issues - did not improve from prior to surgery - but feels her motion is about the same than L now - but she do show decrease supinatoin and decrease grip and prehenstion strength- pt can bendfit from increase ROM , grip strength -and education of joint protection and AE to decreaes pain    Rehab Potential Good   OT Frequency 1x / week   OT Duration 4 weeks   OT Treatment/Interventions Self-care/ADL training;Moist Heat;Parrafin;Therapeutic exercise;Patient/family education;Manual Therapy;Splinting;DME and/or AE instruction;Passive range of motion  Plan assess progress in pain , grip ?   OT Home Exercise Plan see pt instruction   Consulted and Agree with Plan of Care  Patient      Patient will benefit from skilled therapeutic intervention in order to improve the following deficits and impairments:  Decreased range of motion, Impaired flexibility, Decreased coordination, Decreased knowledge of use of DME, Decreased strength, Pain, Impaired UE functional use  Visit Diagnosis: Pain in right hand - Plan: Ot plan of care cert/re-cert  Pain in right wrist - Plan: Ot plan of care cert/re-cert  Stiffness of right wrist, not elsewhere classified - Plan: Ot plan of care cert/re-cert  Muscle weakness (generalized) - Plan: Ot plan of care cert/re-cert    Problem List Patient Active Problem List   Diagnosis Date Noted  . RLS (restless legs syndrome) 04/25/2016  . Dizziness 04/25/2016  . Generalized anxiety disorder 04/25/2016  . Depression 04/25/2016  . HTN (hypertension) 04/25/2016  . OSA (obstructive sleep apnea) 04/25/2016  . DM2 (diabetes mellitus, type 2) ([place]) 04/25/2016  . Migraine 04/25/2016    Rosalyn Gess OTR/L,CLT  05/12/2016, 5:51 PM  [place] [address] [place], [address] Phone: [phone]   Fax:  [phone]  Name: CHASTIDY SWARTZLANDER MRN: [id number] Date of Birth: [birth year]

## 2016-05-13 ENCOUNTER — Encounter: Payer: Medicare Other | Admitting: Physical Therapy

## 2016-05-18 ENCOUNTER — Ambulatory Visit: Payer: Medicare Other | Admitting: Occupational Therapy

## 2016-05-18 DIAGNOSIS — M6281 Muscle weakness (generalized): Secondary | ICD-10-CM

## 2016-05-18 DIAGNOSIS — M79641 Pain in right hand: Secondary | ICD-10-CM | POA: Diagnosis not present

## 2016-05-18 DIAGNOSIS — M25631 Stiffness of right wrist, not elsewhere classified: Secondary | ICD-10-CM

## 2016-05-18 DIAGNOSIS — M25531 Pain in right wrist: Secondary | ICD-10-CM

## 2016-05-18 NOTE — Therapy (Signed)
[place] [facility] [address], [place], [address] Phone: [phone]   Fax:  [phone]  Occupational Therapy Treatment  Patient Details  Name: Allison Blackburn MRN: [id number] Date of Birth: January 12, 1941 Referring Provider: Jarold Song  Encounter Date: 05/18/2016      OT End of Session - 05/18/16 1107    Visit Number 2   Number of Visits 4   Date for OT Re-Evaluation 06/09/16   OT Start Time [id number]   OT Stop Time 1133   OT Time Calculation (min) 46 min   Activity Tolerance Patient tolerated treatment well   Behavior During Therapy [facility] for tasks assessed/performed      Past Medical History  Diagnosis Date  . Hypertension   . Diabetes mellitus without complication ([place])   . Arthritis   . Sleep apnea     CPAP  . GERD (gastroesophageal reflux disease)     in past.  corrected with [employer].  Marland Kitchen Headache     migraines in past.  now some from C5-C7 disc degeneration  . Stiff neck     degen disc C5-C7.  Limited right/left turn.  OK up/down motion.  . Degenerative disc disease, cervical     C5-C7  . Motion sickness     cars/mountains  . Wears hearing aid   . Depression   . Anxiety   . Sodium (Na) deficiency     had "drip" during knee surg.  Sodium levels very low for extended time after.  OK now    Past Surgical History  Procedure Laterality Date  . Cholecystectomy    . Carpal tunnel release    . Replacement total knee bilateral    . Revision total knee arthroplasty Bilateral     after fall  . Shoulder surgery Right     x2  . Tubal ligation    . Wrist surgery Right 02/02/16    [facility]  . Nissen fundoplication  [id number]  . Cataract extraction w/phaco Left 02/17/2016    Procedure: CATARACT EXTRACTION PHACO AND INTRAOCULAR LENS PLACEMENT (IOC);  Surgeon: Leandrew Koyanagi, MD;  Location: [place];  Service: Ophthalmology;  Laterality: Left;  DIABETIC - oral meds CPAP  . Cataract extraction w/phaco Right  04/06/2016    Procedure: CATARACT EXTRACTION PHACO AND INTRAOCULAR LENS PLACEMENT (IOC) right eye;  Surgeon: Leandrew Koyanagi, MD;  Location: [place];  Service: Ophthalmology;  Laterality: Right;  DIABETIC - oral meds CPAP    There were no vitals filed for this visit.      Subjective Assessment - 05/18/16 1051    Subjective  Doing okay - no dizziness - doing better and next week going to be better - I worked on my wrist and hand - more than I usually - but it hurts more than writing    Patient Stated Goals Want to get more flexibillity , and get pain better - wants to write better  , painting    Currently in Pain? Yes   Pain Score 1    Pain Location Wrist            [facility] OT Assessment - 05/18/16 0001    AROM   Right Forearm Supination 85 Degrees   Right Wrist Extension 32 Degrees   Right Wrist Flexion 62 Degrees   Strength   Right Hand Grip (lbs) 23   Right Hand Lateral Pinch 9 lbs   Right Hand 3 Point Pinch 8 lbs   Left  Hand Grip (lbs) 35   Left Hand Lateral Pinch 18 lbs   Left Hand 3 Point Pinch 14 lbs                  OT Treatments/Exercises (OP) - 05/18/16 0001    RUE Paraffin   Number Minutes Paraffin 10 Minutes   RUE Paraffin Location Hand   Comments at [facility] after measurements taken - to decreasepain and increase ROM      Measurement taken for ROM at wrist nad grip /prehension strength See flowsheet  16 hammer for wrist supination /pronation  RD and UD over armrest - stop when pull - Wrist extention to neutral and relax into flexion - 10 reps  Keep pain under 1-2/10  Attempted teal putty - pt report increase pain - even with cues to not squeeze to tight  Hold off on putty  and fitted with neoprene splint  To sue with pain full act during day - Benik neoprene   Ed on joint protection - using large joint , avoid tight and prolonged grip              OT Education - 05/18/16 1107    Education provided Yes   Education Details   HEP   Person(s) Educated Patient   Methods Explanation;Demonstration;Tactile cues;Verbal cues   Comprehension Verbal cues required;Returned demonstration;Verbalized understanding          OT Short Term Goals - 05/12/16 1742    OT SHORT TERM GOAL #1   Title Pain on PRHWE improve with 10-15 points    Baseline Pain on PRWHE at eval 36/50   Time 3   Period Weeks   Status New   OT SHORT TERM GOAL #2   Title Grip strength improve with at least 5 lbs to hold glass and drive with more ease    Baseline Grip R 20 and L 35   Time 3   Period Weeks   Status New           OT Long Term Goals - 05/12/16 1744    OT LONG TERM GOAL #1   Title Pt to be Ind in HEP to increase AROM at wrist to increase functional use with painting , fixing hair, do makeup    Baseline Wrist AROM see flowsheet   Time 4   Period Weeks   Status New   OT LONG TERM GOAL #2   Title Pt to  verbalize 3 joint protection and AE to use at home to decrease pain and increase ease in doing ADL's and IADL"S   Baseline in need for info   Time 4   Period Weeks   Status New               Plan - 05/18/16 1429    Clinical Impression Statement Pt show increase wrist flexion and supination - pt to hold off on further PROM - and work on strengthening without increasing pain - startd wit habout 1 lbs - for wrist in all planes - and hold off on putty - report increase pain over wrist and  hand - but admit she over did it -  pt fitted with [employer]Enk soft neoprene to use during  day in painful actvities  - off other times    Rehab Potential Good   OT Frequency 2x / week   OT Duration 4 weeks   OT Treatment/Interventions Self-care/ADL training;Moist Heat;Parrafin;Therapeutic exercise;Patient/family education;Manual Therapy;Splinting;DME and/or AE instruction;Passive range of motion  Plan assess iif pain decrease - how doing with HEP changes    OT Home Exercise Plan see pt instruction   Consulted and Agree with Plan of Care  Patient      Patient will benefit from skilled therapeutic intervention in order to improve the following deficits and impairments:  Decreased range of motion, Impaired flexibility, Decreased coordination, Decreased knowledge of use of DME, Decreased strength, Pain, Impaired UE functional use  Visit Diagnosis: Pain in right hand  Pain in right wrist  Stiffness of right wrist, not elsewhere classified  Muscle weakness (generalized)    Problem List Patient Active Problem List   Diagnosis Date Noted  . RLS (restless legs syndrome) 04/25/2016  . Dizziness 04/25/2016  . Generalized anxiety disorder 04/25/2016  . Depression 04/25/2016  . HTN (hypertension) 04/25/2016  . OSA (obstructive sleep apnea) 04/25/2016  . DM2 (diabetes mellitus, type 2) ([place]) 04/25/2016  . Migraine 04/25/2016    Rosalyn Gess OTR/L,CLT  05/18/2016, 2:32 PM  [place] [address], [place], [address] Phone: [phone]   Fax:  [phone]  Name: Allison Blackburn MRN: [id number] Date of Birth: Apr 19, 1941

## 2016-05-18 NOTE — Patient Instructions (Addendum)
Pt to hold off oo PROM  And do 16 oz hammer close to hand holding  For wrist in all planes - pain less than 2/10  10 reps   Hold off on putty for gripping

## 2016-05-20 ENCOUNTER — Encounter: Payer: Medicare Other | Admitting: Physical Therapy

## 2016-05-27 ENCOUNTER — Encounter: Payer: Medicare Other | Admitting: Physical Therapy

## 2016-05-27 ENCOUNTER — Ambulatory Visit: Payer: Medicare Other | Admitting: Occupational Therapy

## 2016-05-27 DIAGNOSIS — M25631 Stiffness of right wrist, not elsewhere classified: Secondary | ICD-10-CM

## 2016-05-27 DIAGNOSIS — M79641 Pain in right hand: Secondary | ICD-10-CM | POA: Diagnosis not present

## 2016-05-27 DIAGNOSIS — M6281 Muscle weakness (generalized): Secondary | ICD-10-CM

## 2016-05-27 DIAGNOSIS — M25531 Pain in right wrist: Secondary | ICD-10-CM

## 2016-05-27 NOTE — Patient Instructions (Addendum)
Same HEP1 lbs   Keep pain under 1-2/10  Light blue putty to cont with now for gripping  cont with neoprene splint To use with pain full act during day - Benik neoprene   Ed on joint protection - using large joint , avoid tight and prolonged grip

## 2016-05-27 NOTE — Therapy (Signed)
[name] [address] [place], [address] Phone: [phone]   Fax:  [phone]  Occupational Therapy Treatment  Patient Details  Name: Allison Blackburn MRN: [id number] Date of Birth: July 03, 1941 Referring Provider: Jarold Song  Encounter Date: 05/27/2016      OT End of Session - 05/27/16 0935    Visit Number 3   Number of Visits 4   Date for OT Re-Evaluation 06/09/16   OT Start Time 0911   OT Stop Time 0953   OT Time Calculation (min) 42 min   Activity Tolerance Patient tolerated treatment well   Behavior During Therapy [facility] for tasks assessed/performed      Past Medical History  Diagnosis Date  . Hypertension   . Diabetes mellitus without complication ([place])   . Arthritis   . Sleep apnea     CPAP  . GERD (gastroesophageal reflux disease)     in past.  corrected with [employer].  Marland Kitchen Headache     migraines in past.  now some from C5-C7 disc degeneration  . Stiff neck     degen disc C5-C7.  Limited right/left turn.  OK up/down motion.  . Degenerative disc disease, cervical     C5-C7  . Motion sickness     cars/[place]ains  . Wears hearing aid   . Depression   . Anxiety   . Sodium (Na) deficiency     had "drip" during knee surg.  Sodium levels very low for extended time after.  OK now    Past Surgical History  Procedure Laterality Date  . Cholecystectomy    . Carpal tunnel release    . Replacement total knee bilateral    . Revision total knee arthroplasty Bilateral     after fall  . Shoulder surgery Right     x2  . Tubal ligation    . Wrist surgery Right 02/02/16    [facility]  . Nissen fundoplication  [id number]  . Cataract extraction w/phaco Left 02/17/2016    Procedure: CATARACT EXTRACTION PHACO AND INTRAOCULAR LENS PLACEMENT (IOC);  Surgeon: Leandrew Koyanagi, MD;  Location: [place];  Service: Ophthalmology;  Laterality: Left;  DIABETIC - oral meds CPAP  . Cataract extraction w/phaco Right  04/06/2016    Procedure: CATARACT EXTRACTION PHACO AND INTRAOCULAR LENS PLACEMENT (IOC) right eye;  Surgeon: Leandrew Koyanagi, MD;  Location: [place];  Service: Ophthalmology;  Laterality: Right;  DIABETIC - oral meds CPAP    There were no vitals filed for this visit.      Subjective Assessment - 05/27/16 0919    Subjective  I feeling lately better - bloodsugar and joints better- I worked in yard this am - and was at groceries store - wrist functioning little better -even starting car key - open door and cutting food better    Patient Stated Goals Want to get more flexibillity , and get pain better - wants to write better  , painting    Currently in Pain? Yes   Pain Score 1    Pain Location Wrist   Pain Orientation Right   Pain Descriptors / Indicators Aching            [facility] OT Assessment - 05/27/16 0001    AROM   Right Wrist Extension 32 Degrees   Right Wrist Flexion 62 Degrees   Strength   Right Hand Grip (lbs) 26   Right Hand Lateral Pinch 10 lbs   Right Hand 3  Point Pinch 10 lbs   Left Hand Grip (lbs) 35                  OT Treatments/Exercises (OP) - 05/27/16 0001    RUE Paraffin   Number Minutes Paraffin 10 Minutes   RUE Paraffin Location Hand   Comments After measure - at [facility] to decrease pain at thumb and wrist     Measurement taken for ROM at wrist nand grip /prehension strength See flowsheet  1lbs weight wrist supination /pronation  RD and UD in standing on side  Wrist extention to neutral and relax into flexion - 10 reps  Needed min A - verbal cues  Keep pain under 1-2/10  Light blue putty to cont with now for gripping  cont with neoprene splint To use with pain full act during day - Benik neoprene   Ed on joint protection - using large joint , avoid tight and prolonged grip            OT Education - 05/27/16 0934    Education provided Yes   Education Details HEP update   Person(s) Educated Patient   Methods  Explanation;Demonstration;Verbal cues;Tactile cues   Comprehension Verbalized understanding;Returned demonstration;Verbal cues required          OT Short Term Goals - 05/12/16 1742    OT SHORT TERM GOAL #1   Title Pain on PRHWE improve with 10-15 points    Baseline Pain on PRWHE at eval 36/50   Time 3   Period Weeks   Status New   OT SHORT TERM GOAL #2   Title Grip strength improve with at least 5 lbs to hold glass and drive with more ease    Baseline Grip R 20 and L 35   Time 3   Period Weeks   Status New           OT Long Term Goals - 05/12/16 1744    OT LONG TERM GOAL #1   Title Pt to be Ind in HEP to increase AROM at wrist to increase functional use with painting , fixing hair, do makeup    Baseline Wrist AROM see flowsheet   Time 4   Period Weeks   Status New   OT LONG TERM GOAL #2   Title Pt to  verbalize 3 joint protection and AE to use at home to decrease pain and increase ease in doing ADL's and IADL"S   Baseline in need for info   Time 4   Period Weeks   Status New               Plan - 05/27/16 0935    Clinical Impression Statement Pt showing increase  grip and prehension strenght- pt report pain decrease and report increase use - still cont to have some pain and decrease strength    Rehab Potential Good   OT Frequency 2x / week   OT Duration 4 weeks   OT Treatment/Interventions Self-care/ADL training;Moist Heat;Parrafin;Therapeutic exercise;Patient/family education;Manual Therapy;Splinting;DME and/or AE instruction;Passive range of motion   Plan increase strength    OT Home Exercise Plan see pt instruction   Consulted and Agree with Plan of Care Patient      Patient will benefit from skilled therapeutic intervention in order to improve the following deficits and impairments:  Decreased range of motion, Impaired flexibility, Decreased coordination, Decreased knowledge of use of DME, Decreased strength, Pain, Impaired UE functional use  Visit  Diagnosis: Pain in right hand  Pain in right wrist  Stiffness of right wrist, not elsewhere classified  Muscle weakness (generalized)    Problem List Patient Active Problem List   Diagnosis Date Noted  . RLS (restless legs syndrome) 04/25/2016  . Dizziness 04/25/2016  . Generalized anxiety disorder 04/25/2016  . Depression 04/25/2016  . HTN (hypertension) 04/25/2016  . OSA (obstructive sleep apnea) 04/25/2016  . DM2 (diabetes mellitus, type 2) ([place]) 04/25/2016  . Migraine 04/25/2016    Rosalyn Gess OTR/L,CLT  05/27/2016, 12:22 PM  [place] [address] [place], [address] Phone: [phone]   Fax:  [phone]  Name: MAYLYNN STROWBRIDGE MRN: [id number] Date of Birth: Oct 10, 1941

## 2016-06-01 ENCOUNTER — Ambulatory Visit: Payer: Medicare Other | Attending: Orthopedic Surgery | Admitting: Occupational Therapy

## 2016-06-01 DIAGNOSIS — M79641 Pain in right hand: Secondary | ICD-10-CM | POA: Diagnosis not present

## 2016-06-01 DIAGNOSIS — M25531 Pain in right wrist: Secondary | ICD-10-CM | POA: Insufficient documentation

## 2016-06-01 DIAGNOSIS — M25631 Stiffness of right wrist, not elsewhere classified: Secondary | ICD-10-CM | POA: Diagnosis present

## 2016-06-01 DIAGNOSIS — M6281 Muscle weakness (generalized): Secondary | ICD-10-CM

## 2016-06-01 NOTE — Patient Instructions (Addendum)
Pt ed on pacing self and taking breaks - if increase pain like today  Hold of on HEP - but work on pain control and ROM   If pain free can do 1 lbs for wrist and  Light blue putty  for gripping  cont with neoprene splint To use with pain full act during day - Benik neoprene   Ed on joint protection - using large joint , avoid tight and prolonged grip

## 2016-06-01 NOTE — Therapy (Signed)
[facility] [address] [place], [address] Phone: [phone]   Fax:  [phone]  Occupational Therapy Treatment  Patient Details  Name: Allison Blackburn MRN: [id number] Date of Birth: [birth year] Referring Provider: Jarold Song  Encounter Date: 06/01/2016      OT End of Session - 06/01/16 1154    Visit Number 4   Date for OT Re-Evaluation 06/09/16   OT Start Time 1154   OT Stop Time 1230   OT Time Calculation (min) 36 min   Activity Tolerance Patient tolerated treatment well   Behavior During Therapy [facility] for tasks assessed/performed      Past Medical History  Diagnosis Date  . Hypertension   . Diabetes mellitus without complication ([place])   . Arthritis   . Sleep apnea     CPAP  . GERD (gastroesophageal reflux disease)     in past.  corrected with [employer].  Marland Kitchen Headache     migraines in past.  now some from C5-C7 disc degeneration  . Stiff neck     degen disc C5-C7.  Limited right/left turn.  OK up/down motion.  . Degenerative disc disease, cervical     C5-C7  . Motion sickness     cars/mountains  . Wears hearing aid   . Depression   . Anxiety   . Sodium (Na) deficiency     had "drip" during knee surg.  Sodium levels very low for extended time after.  OK now    Past Surgical History  Procedure Laterality Date  . Cholecystectomy    . Carpal tunnel release    . Replacement total knee bilateral    . Revision total knee arthroplasty Bilateral     after fall  . Shoulder surgery Right     x2  . Tubal ligation    . Wrist surgery Right 02/02/16    [facility]  . Nissen fundoplication  [id number]  . Cataract extraction w/phaco Left 02/17/2016    Procedure: CATARACT EXTRACTION PHACO AND INTRAOCULAR LENS PLACEMENT (IOC);  Surgeon: Leandrew Koyanagi, MD;  Location: [place];  Service: Ophthalmology;  Laterality: Left;  DIABETIC - oral meds CPAP  . Cataract extraction w/phaco Right 04/06/2016    Procedure:  CATARACT EXTRACTION PHACO AND INTRAOCULAR LENS PLACEMENT (IOC) right eye;  Surgeon: Leandrew Koyanagi, MD;  Location: [place];  Service: Ophthalmology;  Laterality: Right;  DIABETIC - oral meds CPAP    There were no vitals filed for this visit.      Subjective Assessment - 06/01/16 1201    Subjective  I   Patient Stated Goals Want to get more flexibillity , and get pain better - wants to write better  , painting    Pain Score 2    Pain Location Wrist   Pain Orientation Right   Pain Descriptors / Indicators Aching            [facility] OT Assessment - 06/01/16 0001    AROM   Right Wrist Extension 20 Degrees   Right Wrist Flexion 55 Degrees                  OT Treatments/Exercises (OP) - 06/01/16 0001    RUE Paraffin   Number Minutes Paraffin 10 Minutes   RUE Paraffin Location Hand   Comments at [facility] to decrease wrist pain  prior to review of HEP       assess AROM - pt arrive wth increase pain  See measurements   pt ed on joint protection ., modifications again - pt over did yard work And planning to clean house  She lost AROM at wrist because of pain   during session did prayer stretch  AROM for wrist flexion  RD and UD AROM  Gained back AROM except wrist extention   Do strengthening HEP when not over did activities and in pain  To ask surgeon what expectation is for wrist extention               OT Education - 06/01/16 1153    Education provided Yes   Education Details HEP   Person(s) Educated Patient   Methods Explanation;Demonstration;Tactile cues;Verbal cues   Comprehension Verbal cues required;Returned demonstration;Verbalized understanding          OT Short Term Goals - 05/12/16 1742    OT SHORT TERM GOAL #1   Title Pain on PRHWE improve with 10-15 points    Baseline Pain on PRWHE at eval 36/50   Time 3   Period Weeks   Status New   OT SHORT TERM GOAL #2   Title Grip strength improve with at least 5 lbs to hold glass  and drive with more ease    Baseline Grip R 20 and L 35   Time 3   Period Weeks   Status New           OT Long Term Goals - 05/12/16 1744    OT LONG TERM GOAL #1   Title Pt to be Ind in HEP to increase AROM at wrist to increase functional use with painting , fixing hair, do makeup    Baseline Wrist AROM see flowsheet   Time 4   Period Weeks   Status New   OT LONG TERM GOAL #2   Title Pt to  verbalize 3 joint protection and AE to use at home to decrease pain and increase ease in doing ADL's and IADL"S   Baseline in need for info   Time 4   Period Weeks   Status New               Plan - 06/01/16 1204    Clinical Impression Statement Pt showed increase pain this date with decrease AROM at wrist - worked in yard for 2-3 days - ed pt on joint protection , modifications and to hold off on strenthening if over did activities    Rehab Potential Good   OT Frequency 2x / week   OT Duration 4 weeks   OT Treatment/Interventions Self-care/ADL training;Moist Heat;Parrafin;Therapeutic exercise;Patient/family education;Manual Therapy;Splinting;DME and/or AE instruction;Passive range of motion   Plan increase strenght to increase pain    OT Home Exercise Plan see pt instruction   Consulted and Agree with Plan of Care Patient      Patient will benefit from skilled therapeutic intervention in order to improve the following deficits and impairments:  Decreased range of motion, Impaired flexibility, Decreased coordination, Decreased knowledge of use of DME, Decreased strength, Pain, Impaired UE functional use  Visit Diagnosis: Pain in right hand  Pain in right wrist  Stiffness of right wrist, not elsewhere classified  Muscle weakness (generalized)    Problem List Patient Active Problem List   Diagnosis Date Noted  . RLS (restless legs syndrome) 04/25/2016  . Dizziness 04/25/2016  . Generalized anxiety disorder 04/25/2016  . Depression 04/25/2016  . HTN (hypertension)  04/25/2016  . OSA (obstructive sleep apnea) 04/25/2016  . DM2 (diabetes mellitus, type  2) ([place]) 04/25/2016  . Migraine 04/25/2016    Rosalyn Gess OTR/L,CLT  06/01/2016, 3:56 PM  [place] [facility] [address] [place], [address] Phone: [phone]   Fax:  [phone]  Name: Allison Blackburn MRN: [id number] Date of Birth: Jun 30, 1941

## 2016-06-07 ENCOUNTER — Encounter: Payer: Self-pay | Admitting: *Deleted

## 2016-06-07 NOTE — Progress Notes (Signed)
Received notice from [facility] that Ferritin, serum denied by pt insurance company. Dr Jaynee Eagles resubmitted with codes E13.9, D50.8, R79.0.

## 2016-06-09 ENCOUNTER — Ambulatory Visit: Payer: Medicare Other | Admitting: Occupational Therapy

## 2016-06-09 DIAGNOSIS — M25631 Stiffness of right wrist, not elsewhere classified: Secondary | ICD-10-CM

## 2016-06-09 DIAGNOSIS — M79641 Pain in right hand: Secondary | ICD-10-CM | POA: Diagnosis not present

## 2016-06-09 DIAGNOSIS — M25531 Pain in right wrist: Secondary | ICD-10-CM

## 2016-06-09 DIAGNOSIS — M6281 Muscle weakness (generalized): Secondary | ICD-10-CM

## 2016-06-09 NOTE — Therapy (Signed)
[facility] [facility] [address], [place], [address] Phone: [phone]   Fax:  [phone]  Occupational Therapy Treatment  Patient Details  Name: Allison Blackburn MRN: [id number] Date of Birth: 1941/05/09 Referring Provider: Jarold Song  Encounter Date: 06/09/2016      OT End of Session - 06/09/16 1249    Visit Number 5   Number of Visits 5   Date for OT Re-Evaluation 06/09/16   OT Start Time 1102   OT Stop Time 1133   OT Time Calculation (min) 31 min   Activity Tolerance Patient tolerated treatment well   Behavior During Therapy [facility] for tasks assessed/performed      Past Medical History  Diagnosis Date  . Hypertension   . Diabetes mellitus without complication ([place])   . Arthritis   . Sleep apnea     CPAP  . GERD (gastroesophageal reflux disease)     in past.  corrected with [employer].  Marland Kitchen Headache     migraines in past.  now some from C5-C7 disc degeneration  . Stiff neck     degen disc C5-C7.  Limited right/left turn.  OK up/down motion.  . Degenerative disc disease, cervical     C5-C7  . Motion sickness     cars/mountains  . Wears hearing aid   . Depression   . Anxiety   . Sodium (Na) deficiency     had "drip" during knee surg.  Sodium levels very low for extended time after.  OK now    Past Surgical History  Procedure Laterality Date  . Cholecystectomy    . Carpal tunnel release    . Replacement total knee bilateral    . Revision total knee arthroplasty Bilateral     after fall  . Shoulder surgery Right     x2  . Tubal ligation    . Wrist surgery Right 02/02/16    [facility]  . Nissen fundoplication  8101  . Cataract extraction w/phaco Left 02/17/2016    Procedure: CATARACT EXTRACTION PHACO AND INTRAOCULAR LENS PLACEMENT (IOC);  Surgeon: Leandrew Koyanagi, MD;  Location: [place];  Service: Ophthalmology;  Laterality: Left;  DIABETIC - oral meds CPAP  . Cataract extraction w/phaco Right  04/06/2016    Procedure: CATARACT EXTRACTION PHACO AND INTRAOCULAR LENS PLACEMENT (IOC) right eye;  Surgeon: Leandrew Koyanagi, MD;  Location: [place];  Service: Ophthalmology;  Laterality: Right;  DIABETIC - oral meds CPAP    There were no vitals filed for this visit.      Subjective Assessment - 06/09/16 1107    Subjective  I worked this week so much in garden - my hand  I am able to use it more and it is stronger - but pain still there - but my bones takes a long time to heal -    Patient Stated Goals Want to get more flexibillity , and get pain better - wants to write better  , painting    Currently in Pain? Yes   Pain Score 2    Pain Location Wrist   Pain Orientation Right   Pain Descriptors / Indicators Aching   Pain Type Chronic pain   Pain Onset 1 to 4 weeks ago            [facility] OT Assessment - 06/09/16 0001    AROM   Right Wrist Extension 32 Degrees   Right Wrist Flexion 52 Degrees   Right Wrist Radial Deviation  11 Degrees   Right Wrist Ulnar Deviation 8 Degrees   Strength   Right Hand Grip (lbs) 24   Right Hand Lateral Pinch 8 lbs   Right Hand 3 Point Pinch 9 lbs   Left Hand Grip (lbs) 35   Left Hand Lateral Pinch 18 lbs   Left Hand 3 Point Pinch 14 lbs      PRWHE done - simulated - pain  improve to 23/50 and improve to 8.5/50 Pt ed and reviewed joint protection principles and AE                       OT Education - 06/09/16 1249    Education provided Yes   Education Details discharge instruction and jooint protection    Person(s) Educated Patient   Methods Explanation;Demonstration;Tactile cues;Verbal cues   Comprehension Verbal cues required;Returned demonstration;Verbalized understanding          OT Short Term Goals - 06/09/16 1127    OT SHORT TERM GOAL #1   Title Pain on PRHWE improve with 10-15 points    Baseline Pain on PRWHE at eval 36/50 , improve to 23/50   Status Achieved   OT SHORT TERM GOAL #2   Title  Grip strength improve with at least 5 lbs to hold glass and drive with more ease    Baseline grip 24 R , L 35    Status Achieved           OT Long Term Goals - 06/09/16 1128    OT LONG TERM GOAL #1   Title Pt to be Ind in HEP to increase AROM at wrist to increase functional use with painting , fixing hair, do makeup    Baseline see flowsheet for progress   Status Achieved   OT LONG TERM GOAL #2   Title Pt to  verbalize 3 joint protection and AE to use at home to decrease pain and increase ease in doing ADL's and IADL"S   Baseline  made changes   Status Achieved               Plan - 06/09/16 1250    Clinical Impression Statement Pt showed progress in AROM and strenght in R hand and wrist - decrease pain and increase function on PRWHE outcome measure - still pain but pt ed on jointprotection principles and AE - pt met goals and discharge with homeprogram  - pt to see MD next week    OT Treatment/Interventions Self-care/ADL training;Moist Heat;Parrafin;Therapeutic exercise;Patient/family education;Manual Therapy;Splinting;DME and/or AE instruction;Passive range of motion   Plan discharge    OT Home Exercise Plan see pt instruction   Consulted and Agree with Plan of Care Patient      Patient will benefit from skilled therapeutic intervention in order to improve the following deficits and impairments:     Visit Diagnosis: Pain in right hand  Pain in right wrist  Stiffness of right wrist, not elsewhere classified  Muscle weakness (generalized)    Problem List Patient Active Problem List   Diagnosis Date Noted  . RLS (restless legs syndrome) 04/25/2016  . Dizziness 04/25/2016  . Generalized anxiety disorder 04/25/2016  . Depression 04/25/2016  . HTN (hypertension) 04/25/2016  . OSA (obstructive sleep apnea) 04/25/2016  . DM2 (diabetes mellitus, type 2) ([place]) 04/25/2016  . Migraine 04/25/2016    Rosalyn Gess OTR/L,CLT  06/09/2016, 12:53 PM  [facility] [address],  [place], [address] Phone: [phone]   Fax:  [phone]  Name: Allison Blackburn MRN: [id number] Date of Birth: 1941/03/29

## 2016-07-15 ENCOUNTER — Telehealth: Payer: Self-pay | Admitting: Neurology

## 2016-07-15 NOTE — Telephone Encounter (Signed)
Patient receiving iron infusing via Tina in intrafusion. Notified tina of approval.

## 2016-07-15 NOTE — Telephone Encounter (Signed)
Tamika with [employer] is calling to follow up on the appeal that was sent for medication Venofer for the patient.  Tamika says the medication has been approved and the Auth# is  [id number].

## 2016-08-03 ENCOUNTER — Telehealth: Payer: Self-pay | Admitting: Neurology

## 2016-08-03 ENCOUNTER — Encounter: Payer: Self-pay | Admitting: Neurology

## 2016-08-03 ENCOUNTER — Ambulatory Visit (INDEPENDENT_AMBULATORY_CARE_PROVIDER_SITE_OTHER): Payer: Medicare Other | Admitting: Neurology

## 2016-08-03 VITALS — BP 130/76 | HR 84 | Ht 60.5 in | Wt 175.8 lb

## 2016-08-03 DIAGNOSIS — R790 Abnormal level of blood mineral: Secondary | ICD-10-CM

## 2016-08-03 DIAGNOSIS — F411 Generalized anxiety disorder: Secondary | ICD-10-CM | POA: Diagnosis not present

## 2016-08-03 DIAGNOSIS — F410 Panic disorder [episodic paroxysmal anxiety] without agoraphobia: Secondary | ICD-10-CM

## 2016-08-03 DIAGNOSIS — D509 Iron deficiency anemia, unspecified: Secondary | ICD-10-CM | POA: Diagnosis not present

## 2016-08-03 DIAGNOSIS — G2581 Restless legs syndrome: Secondary | ICD-10-CM | POA: Diagnosis not present

## 2016-08-03 DIAGNOSIS — M792 Neuralgia and neuritis, unspecified: Secondary | ICD-10-CM | POA: Diagnosis not present

## 2016-08-03 MED ORDER — LIDOCAINE 5 % EX OINT: 1.0000 "application " | TOPICAL_OINTMENT | CUTANEOUS | 3 refills | Status: DC | PRN

## 2016-08-03 NOTE — Progress Notes (Signed)
[facility]    Provider:  Dr Jaynee Eagles Referring Provider: Rusty Aus, MD Primary Care Physician:  Rusty Aus, MD  CC:  Dizziness  Interval history 08/03/2016: She had a wonderful June and July without symptoms and in August she had extremely stressful time with her son and her left ear started hurting and she has pain on the left side around the ear and she went to [facility] to the ENT and they saw a nodule on her thyroid and she is having ultrasound and biopsy. Her wooziness improved. She had panic attacks which lasted all day yesterday she has a history of anxiety and panic attacks. She has clonazepam for her panic attacks as well as takes it daily. She takes .5mg  mirapex in the afternoon and at night for restless legs which is 1/2 of what she was taking before as the RLS have improved with iron infusion. The Lyrica and Neurontin did not work and she had RLS all night long. Will continue mirapex at current dose. Follow up as needed. Will send her to biofeedback for her anxiety.   HPI:  Allison Blackburn is a 75 y.o. female here as a referral from Dr. Sabra Heck for "feeling woozy". PNHx anxiety, depression, HTN, hypersomnia with sleep apnea, obesity, neuropathy, RLS, DM2, migraines. She feels like she is on a rocking boat. She also has symptoms when sitting but not as bad as when walking. Not just when walking. A "woozy" feeling. Hand surgery on March 7th, Cataract surgery on 22nd, started feeling woozy on the 26th and 27th,  She was on hydrocodone for 3 weeks, on the 25th she stopped the hydrocone. She has been feeling woozy since then. She saw her regular doctor the day after Easter and she saw an ENT. Second catract surgery on May 10th. Last Monday she cut back on the mirapex, she thinks she overdosed on it. She feels better. But she still feels "woozy" now but better since decreasing her dopamine agonist for RLS. She is compliant on her cpap. She has had a headache lately. She  is under extreme stress because her son has prostate cancer and the marriage is a mess and her grandson is in trouble. Steroids did not help with her symptoms. 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 She is only taking one Klonopin, trazodone and mirapex at night. She is also on Cymbalta. Discussed polypharmacy, stress. No other focal neurologic deficits.   Reviewed notes, labs and imaging from outside physicians, which showed:  Reviewed [facility] visits and records and on April 7th 2017 she was seen and evaluated for her dizziness and diagnosis was "Medicine withdrawal-combination of coming off Klonopin to a lower dose plus discontinuing hydrocodone. With persistent symptoms go back on hydrocodone half tab twice daily 7 days, half tab every morning 7 days, quarter tab 2 AM 7 days then off, maintain Klonopin 2 at bedtime"  Patient had an MRI in the ED at [facility] in 02/2016 and "MRI/MRA of brain/neck unremarkable."  MRI of the brain 04/2005: reviewed report: Multiplanar images were obtained through the brain both prior to and following administration of gadolinium. The diffusion-weighted sequences exhibit no objective evidence of acute ischemic change. The inversion recovery-FLAIR sequences reveal small amounts of increased periventricular white matter signal anteriorly in the region of the extreme capsule bilaterally. These areas of increased signal have become slightly more conspicuous and increased in number since the prior study. Elsewhere in the deep white matter of both cerebral  hemispheres there are several punctate areas of increased white matter signal consistent with chronic small vessel  ischemic-type change. The standard spin-echo images reveal the ventricles to be normal in size and position. Following gadolinium administration, the enhancement pattern of the brain parenchyma is normal. The 7th and 8th cranial nerves appear normal at the level of the cerebellopontine  angles. I see no abnormal intra or extraaxial fluid collections on the T2-weighted images. The sella and parasellar structures are normal in appearance.   IMPRESSION:   Since the prior study there has been slight progression of the white matter signal increase in a punctate fashion in both cerebral hemispheres. I do not see evidence of an intracranial mass, hydrocephalus, or an acute ischemic or hemorrhagic event.   I do not see evidence of significant inflammatory change in the visualized portions of the paranasal sinuses or of the mastoid air cells.   Hgba1c 7.6, labs 12/2015 cbc with creatinine 0.7, triglyceride 375, ldl 45, tsh 2.138  Review of Systems: Patient complains of symptoms per HPI as well as the following symptoms: Light sensitivity, diarrhea, joint pain, aching muscles, ear pain, muscle cramps, neck pain, neck stiffness, nervous anxious. Pertinent negatives per HPI. All others negative.   Social History   Social History  . Marital status: Married    Spouse name: N/A  . Number of children: 2  . Years of education: 20   Occupational History  . Not on file.   Social History Main Topics  . Smoking status: Never Smoker  . Smokeless tobacco: Not on file  . Alcohol use No  . Drug use: Unknown  . Sexual activity: Not on file   Other Topics Concern  . Not on file   Social History Narrative   Lives with husband.   Caffeine use:  2 cups coffee per day       Family History  Problem Relation Age of Onset  . Stroke Neg Hx   . Seizures Neg Hx   . Neuropathy Neg Hx   . Parkinsonism Neg Hx   . Migraines Neg Hx   . Ataxia Neg Hx   . Dementia Neg Hx     Past Medical History:  Diagnosis Date  . Anxiety   . Arthritis   . Degenerative disc disease, cervical    C5-C7  . Depression   . Diabetes mellitus without complication ([place])   . GERD (gastroesophageal reflux disease)    in past.  corrected with [employer].  Marland Kitchen Headache    migraines in past.   now some from C5-C7 disc degeneration  . Hypertension   . Motion sickness    cars/mountains  . Sleep apnea    CPAP  . Sodium (Na) deficiency    had "drip" during knee surg.  Sodium levels very low for extended time after.  OK now  . Stiff neck    degen disc C5-C7.  Limited right/left turn.  OK up/down motion.  . Wears hearing aid     Past Surgical History:  Procedure Laterality Date  . CARPAL TUNNEL RELEASE    . CATARACT EXTRACTION W/PHACO Left 02/17/2016   Procedure: CATARACT EXTRACTION PHACO AND INTRAOCULAR LENS PLACEMENT (IOC);  Surgeon: Leandrew Koyanagi, MD;  Location: [facility];  Service: Ophthalmology;  Laterality: Left;  DIABETIC - oral meds CPAP  . CATARACT EXTRACTION W/PHACO Right 04/06/2016   Procedure: CATARACT EXTRACTION PHACO AND INTRAOCULAR LENS PLACEMENT (IOC) right eye;  Surgeon: Leandrew Koyanagi, MD;  Location: [facility];  Service:  Ophthalmology;  Laterality: Right;  DIABETIC - oral meds CPAP  . CHOLECYSTECTOMY    . NISSEN FUNDOPLICATION  [id number]  . REPLACEMENT TOTAL KNEE BILATERAL    . REVISION TOTAL KNEE ARTHROPLASTY Bilateral    after fall  . SHOULDER SURGERY Right    x2  . TUBAL LIGATION    . WRIST SURGERY Right 02/02/16   [facility]    Current Outpatient Prescriptions  Medication Sig Dispense Refill  . amLODipine (NORVASC) 10 MG tablet Take 10 mg by mouth daily. Reported on 04/22/2016    . aspirin 81 MG tablet Take 81 mg by mouth daily.    Marland Kitchen atorvastatin (LIPITOR) 10 MG tablet Take 10 mg by mouth 3 (three) times a week.    . Calcium Citrate-Vitamin D (CALCIUM CITRATE + PO) Take 2 tablets by mouth daily.    . celecoxib (CELEBREX) 200 MG capsule Take 200 mg by mouth daily. AM    . Cholecalciferol (VITAMIN D-3) 1000 units CAPS Take by mouth.    . clonazePAM (KLONOPIN) 0.5 MG tablet Take 1 mg by mouth at bedtime.    . DULoxetine (CYMBALTA) 30 MG capsule Take 30 mg by mouth daily.    Marland Kitchen gabapentin (NEURONTIN) 300 MG capsule One capsule at noon,  2 capsules at night 45 capsule 0  . glimepiride (AMARYL) 1 MG tablet Take 1 mg by mouth QID. 1 tab, 3 times daily.  2 tabs at night (if sugar is high).    . Liraglutide (VICTOZA) 18 MG/3ML SOPN Inject 7.2 mg into the skin daily. Reported on 04/22/2016    . losartan (COZAAR) 50 MG tablet Take 50 mg by mouth daily.    . magnesium oxide (MAG-OX) 400 MG tablet Take 400 mg by mouth 2 (two) times daily.    . metFORMIN (GLUCOPHAGE) 850 MG tablet Take 850 mg by mouth 3 (three) times daily.    . metoprolol (LOPRESSOR) 50 MG tablet Take 50 mg by mouth daily.    . Multiple Vitamins-Minerals (CENTRUM SILVER PO) Take 1 tablet by mouth daily.    . Omega-3 Fatty Acids (FISH OIL) 1200 MG CAPS Take by mouth daily. PM    . Potassium Gluconate 2.5 MEQ TABS Take by mouth.    . pramipexole (MIRAPEX) 0.5 MG tablet Take 0.5 mg by mouth 2 (two) times daily. 2 tabs in afternoon, 2 tabs at night    . traZODone (DESYREL) 100 MG tablet Take 100 mg by mouth at bedtime.     No current facility-administered medications for this visit.     Allergies as of 08/03/2016 - Review Complete 05/12/2016  Allergen Reaction Noted  . Levaquin [levofloxacin in d5w] Other (See Comments) 04/25/2015  . Lyrica [pregabalin]  02/12/2016  . Neurontin [gabapentin]  02/12/2016  . Penicillins Rash 04/25/2015    Vitals: There were no vitals taken for this visit. Last Weight:  Wt Readings from Last 1 Encounters:  04/22/16 179 lb 6.4 oz (81.4 kg)   Last Height:   Ht Readings from Last 1 Encounters:  04/22/16 5' 0.5" (1.537 m)    Physical exam: Exam: Gen: NAD, conversant, well nourised, obese, well groomed                     CV: RRR, no MRG. No Carotid Bruits. No peripheral edema, warm, nontender Eyes: Conjunctivae clear without exudates or hemorrhage  Neuro: Detailed Neurologic Exam  Speech:    Speech is normal; fluent and spontaneous with normal comprehension.  Cognition:  The patient is oriented to person, place, and  time;     recent and remote memory intact;     language fluent;     normal attention, concentration,     fund of knowledge Cranial Nerves:    The pupils are equal, round, and reactive to light. The fundi are normal and spontaneous venous pulsations are present. Visual fields are full to finger confrontation. Extraocular movements are intact. Trigeminal sensation is intact and the muscles of mastication are normal. The face is symmetric. The palate elevates in the midline. Hearing intact. Voice is normal. Shoulder shrug is normal. The tongue has normal motion without fasciculations.   Coordination:    Normal finger to nose and heel to shin.   Gait:    No dysmetria  Motor Observation:    No asymmetry, no atrophy, and no involuntary movements noted. Tone:    Normal muscle tone.    Posture:    Posture is normal. normal erect    Strength:    Strength is V/V in the upper and lower limbs.      Sensation: intact to LT     Reflex Exam:  DTR's: Absent AJs otherwise deep tendon reflexes in the upper and lower extremities are normal bilaterally.   Toes:    The toes are downgoing bilaterally.   Clonus:    Clonus is absent.      Assessment/Plan:   75 y.o. female here as a referral from Dr. Sabra Heck for "feeling woozy" which has improved. PMHx anxiety, depression, HTN, hypersomnia with sleep apnea, obesity, neuropathy, RLS, DM2, migraines.   Remember to drink plenty of fluid, eat healthy meals and do not skip any meals. Try to eat protein with a every meal and eat a healthy snack such as fruit or nuts in between meals. Try to keep a regular sleep-wake schedule and try to exercise daily, particularly in the form of walking, 20-30 minutes a day, if you can.   Dizziness: improved, monitor clinically may be anxiety RLS: improved after iron infusion, continue mirapex at lower dose Anxiety: biofeedback  Proceed to emergency room if you experience new or worsening symptoms or symptoms  do not resolve, if you have new neurologic symptoms or if headache is severe, or for any concerning symptom.    Sarina Ill, MD  [facility] [address] [place], [place] [address]  Phone [phone] Fax [phone] Sarina Ill, MD  [facility] [address] [place], [place] [address]  Phone [phone] Fax [phone]  A total of 40 minutes was spent face-to-face with this patient. Over half this time was spent on counseling patient on the dizziness, RLS, anxiety diagnosis and different diagnostic and therapeutic options available.

## 2016-08-03 NOTE — Patient Instructions (Addendum)
Remember to drink plenty of fluid, eat healthy meals and do not skip any meals. Try to eat protein with a every meal and eat a healthy snack such as fruit or nuts in between meals. Try to keep a regular sleep-wake schedule and try to exercise daily, particularly in the form of walking, 20-30 minutes a day, if you can.   As far as your medications are concerned, I would like to suggest: Continue current dose of mirapex.  As far as diagnostic testing: check ferritin  I would like to see you back as needed, sooner if we need to. Please call us with any interim questions, concerns, problems, updates or refill requests.   Our phone number is [phone]. We also have an after hours call service for urgent matters and there is a physician on-call for urgent questions. For any emergencies you know to call 911 or go to the nearest emergency room

## 2016-08-03 NOTE — Telephone Encounter (Signed)
THANK YOU

## 2016-08-03 NOTE — Telephone Encounter (Signed)
Called [facility] and they offer [facility] [employer] [facility] . [facility] is not covered by any insurance program.   Called and spoke to patient and relayed message to patient that [facility] [employer] [facility] is not covered buy insurance she understood. Patient is going to pay out of pocket to see if she likes program .  Referral will be sent to [facility] telephone [phone] - fax [phone]. [facility] will go over all details with patient when they call to schedule her apt. [facility] is located in [place] and patient is fine with this.

## 2016-08-04 ENCOUNTER — Telehealth: Payer: Self-pay | Admitting: *Deleted

## 2016-08-04 LAB — FERRITIN: Ferritin: 64 ng/mL (ref 15–150)

## 2016-08-04 NOTE — Telephone Encounter (Signed)
Called and spoke to patient about lab results per Dr Jaynee Eagles note. She verbalized understanding.  She requested phone number from [facility] that she spoke with Hinton Dyer C. About yesterday. I advised her to call [phone]. She wrote this down and will f/u on this.

## 2016-08-04 NOTE — Telephone Encounter (Signed)
-----   Message from Melvenia Beam, MD sent at 08/04/2016 12:39 PM EDT ----- Ferritin 64, last 32 which is improved thanks

## 2017-03-29 DIAGNOSIS — Z7982 Long term (current) use of aspirin: Secondary | ICD-10-CM | POA: Insufficient documentation

## 2017-03-29 DIAGNOSIS — Z79899 Other long term (current) drug therapy: Secondary | ICD-10-CM | POA: Insufficient documentation

## 2017-03-29 DIAGNOSIS — Z7984 Long term (current) use of oral hypoglycemic drugs: Secondary | ICD-10-CM | POA: Insufficient documentation

## 2017-03-29 DIAGNOSIS — E119 Type 2 diabetes mellitus without complications: Secondary | ICD-10-CM | POA: Insufficient documentation

## 2017-03-29 DIAGNOSIS — R42 Dizziness and giddiness: Secondary | ICD-10-CM | POA: Insufficient documentation

## 2017-03-29 DIAGNOSIS — N390 Urinary tract infection, site not specified: Secondary | ICD-10-CM | POA: Insufficient documentation

## 2017-03-29 DIAGNOSIS — I10 Essential (primary) hypertension: Secondary | ICD-10-CM | POA: Insufficient documentation

## 2017-03-29 DIAGNOSIS — R109 Unspecified abdominal pain: Secondary | ICD-10-CM | POA: Diagnosis present

## 2017-03-29 LAB — CBC
HCT: 45.6 % (ref 35.0–47.0)
Hemoglobin: 15.8 g/dL (ref 12.0–16.0)
MCH: 32.7 pg (ref 26.0–34.0)
MCHC: 34.7 g/dL (ref 32.0–36.0)
MCV: 94.3 fL (ref 80.0–100.0)
Platelets: 209 10*3/uL (ref 150–440)
RBC: 4.83 MIL/uL (ref 3.80–5.20)
RDW: 12.8 % (ref 11.5–14.5)
WBC: 6.8 10*3/uL (ref 3.6–11.0)

## 2017-03-29 LAB — BASIC METABOLIC PANEL
Anion gap: 8 (ref 5–15)
BUN: 20 mg/dL (ref 6–20)
CO2: 22 mmol/L (ref 22–32)
CREATININE: 0.62 mg/dL (ref 0.44–1.00)
Calcium: 9 mg/dL (ref 8.9–10.3)
Chloride: 105 mmol/L (ref 101–111)
GFR calc Af Amer: >60 mL/min (ref >60)
GLUCOSE: 113 mg/dL — AB (ref 65–99)
Potassium: 3.7 mmol/L (ref 3.5–5.1)
Sodium: 135 mmol/L (ref 135–145)

## 2017-03-29 LAB — TROPONIN I: Troponin I: <0.03 ng/mL (ref <0.03)

## 2017-03-29 NOTE — ED Triage Notes (Signed)
Patient reports "mild" dizziness for approximately 1 year.  Reports today started having abdominal pain and became extremely dizzy and had remained dizzy.

## 2017-03-30 ENCOUNTER — Emergency Department: Payer: Medicare Other

## 2017-03-30 ENCOUNTER — Emergency Department
Admission: EM | Admit: 2017-03-30 | Discharge: 2017-03-30 | Disposition: A | Discharge status: Home / Self Care | Payer: Medicare Other | Attending: Emergency Medicine | Admitting: Emergency Medicine

## 2017-03-30 DIAGNOSIS — R42 Dizziness and giddiness: Secondary | ICD-10-CM

## 2017-03-30 DIAGNOSIS — N390 Urinary tract infection, site not specified: Secondary | ICD-10-CM

## 2017-03-30 LAB — URINALYSIS, COMPLETE (UACMP) WITH MICROSCOPIC
BILIRUBIN URINE: NEGATIVE
Bacteria, UA: NONE SEEN
GLUCOSE, UA: NEGATIVE mg/dL
Hgb urine dipstick: NEGATIVE
KETONES UR: NEGATIVE mg/dL
NITRITE: NEGATIVE
PROTEIN: NEGATIVE mg/dL
RBC / HPF: NONE SEEN RBC/hpf (ref 0–5)
Specific Gravity, Urine: 1.01 (ref 1.005–1.030)
pH: 5 (ref 5.0–8.0)

## 2017-03-30 LAB — TROPONIN I: Troponin I: <0.03 ng/mL (ref <0.03)

## 2017-03-30 MED ORDER — SULFAMETHOXAZOLE-TRIMETHOPRIM 800-160 MG PO TABS: 1.0000 | ORAL_TABLET | Freq: Two times a day (BID) | ORAL | 0 refills | Status: DC

## 2017-03-30 MED ORDER — GADOBENATE DIMEGLUMINE 529 MG/ML IV SOLN
20.0000 mL | Freq: Once | INTRAVENOUS | Status: AC | PRN
  Administered 2017-03-30: 17 mL via INTRAVENOUS

## 2017-03-30 MED ORDER — MECLIZINE HCL 25 MG PO TABS
25.0000 mg | ORAL_TABLET | Freq: Once | ORAL | Status: AC
  Administered 2017-03-30: 25 mg via ORAL
  Filled 2017-03-30: qty 1

## 2017-03-30 MED ORDER — ONDANSETRON HCL 4 MG/2ML IJ SOLN
4.0000 mg | Freq: Once | INTRAMUSCULAR | Status: AC
  Administered 2017-03-30: 4 mg via INTRAVENOUS
  Filled 2017-03-30: qty 2

## 2017-03-30 MED ORDER — SULFAMETHOXAZOLE-TRIMETHOPRIM 800-160 MG PO TABS
1.0000 | ORAL_TABLET | Freq: Once | ORAL | Status: AC
  Administered 2017-03-30: 1 via ORAL
  Filled 2017-03-30: qty 1

## 2017-03-30 NOTE — ED Notes (Signed)

## 2017-03-30 NOTE — ED Provider Notes (Signed)
[facility] Emergency Department Provider Note   ____________________________________________   First MD Initiated Contact with Patient 03/30/17 0101     (approximate)  I have reviewed the triage vital signs and the nursing notes.   HISTORY  Chief Complaint Dizziness and Abdominal Pain    HPI Allison Blackburn is a 76 y.o. female who presents to the ED from home with a chief complaint of dizziness, abdominal discomfort and diarrhea. Patient reports mild dizziness times one year. She has been seen by an ENT specialist and has an appointment with an ophthalmologist at [facility] later this morning to evaluate her for heterophoria. She presents to the ED secondary to acutely worsened dizziness with unsteady gait at home. Describes sensation of room spinning which is affected by positioning. Symptoms were associated with nausea, no vomiting. Denies associated diaphoresis, headache, vision changes, neck pain, chest pain, shortness of breath. Also reports she has been having loose stools times one day associated with abdominal discomfort. At the time of this interview, patient reports dizziness has gone back to her baseline mild dizziness and she is not experiencing abdominal discomfort. Denies recent travel or trauma.   Past Medical History:  Diagnosis Date  . Anxiety   . Arthritis   . Degenerative disc disease, cervical    C5-C7  . Depression   . Diabetes mellitus without complication ([place])   . GERD (gastroesophageal reflux disease)    in past.  corrected with [employer].  Marland Kitchen Headache    migraines in past.  now some from C5-C7 disc degeneration  . Hypertension   . Motion sickness    cars/mountains  . Sleep apnea    CPAP  . Sodium (Na) deficiency    had "drip" during knee surg.  Sodium levels very low for extended time after.  OK now  . Stiff neck    degen disc C5-C7.  Limited right/left turn.  OK up/down motion.  . Wears hearing aid     Patient Active Problem  List   Diagnosis Date Noted  . RLS (restless legs syndrome) 04/25/2016  . Dizziness 04/25/2016  . Generalized anxiety disorder 04/25/2016  . Depression 04/25/2016  . HTN (hypertension) 04/25/2016  . OSA (obstructive sleep apnea) 04/25/2016  . DM2 (diabetes mellitus, type 2) ([place]) 04/25/2016  . Migraine 04/25/2016    Past Surgical History:  Procedure Laterality Date  . CARPAL TUNNEL RELEASE    . CATARACT EXTRACTION W/PHACO Left 02/17/2016   Procedure: CATARACT EXTRACTION PHACO AND INTRAOCULAR LENS PLACEMENT (IOC);  Surgeon: Leandrew Koyanagi, MD;  Location: [place];  Service: Ophthalmology;  Laterality: Left;  DIABETIC - oral meds CPAP  . CATARACT EXTRACTION W/PHACO Right 04/06/2016   Procedure: CATARACT EXTRACTION PHACO AND INTRAOCULAR LENS PLACEMENT ([place]) right eye;  Surgeon: Leandrew Koyanagi, MD;  Location: [place];  Service: Ophthalmology;  Laterality: Right;  DIABETIC - oral meds CPAP  . CHOLECYSTECTOMY    . NISSEN FUNDOPLICATION  6294  . REPLACEMENT TOTAL KNEE BILATERAL    . REVISION TOTAL KNEE ARTHROPLASTY Bilateral    after fall  . SHOULDER SURGERY Right    x2  . TUBAL LIGATION    . WRIST SURGERY Right 02/02/16   [facility]    Prior to Admission medications   Medication Sig Start Date End Date Taking? Authorizing Provider  amLODipine (NORVASC) 10 MG tablet Take 10 mg by mouth daily. Reported on 04/22/2016    Historical Provider, MD  aspirin 81 MG tablet Take 81 mg by mouth daily.  Historical Provider, MD  atorvastatin (LIPITOR) 10 MG tablet Take 10 mg by mouth 3 (three) times a week.    Historical Provider, MD  Calcium Citrate-Vitamin D (CALCIUM CITRATE + PO) Take 2 tablets by mouth daily.    Historical Provider, MD  celecoxib (CELEBREX) 200 MG capsule Take 200 mg by mouth daily. AM    Historical Provider, MD  Cholecalciferol (VITAMIN D-3) 1000 units CAPS Take by mouth.    Historical Provider, MD  clonazePAM (KLONOPIN) 0.5 MG tablet Take 1 mg by  mouth at bedtime.    Historical Provider, MD  DULoxetine (CYMBALTA) 30 MG capsule Take 30 mg by mouth daily.    Historical Provider, MD  glimepiride (AMARYL) 1 MG tablet Take 1 mg by mouth QID. 1 tab, 3 times daily.  2 tabs at night (if sugar is high).    Historical Provider, MD  lidocaine (XYLOCAINE) 5 % ointment Apply 1 application topically as needed. 08/03/16   Melvenia Beam, MD  Liraglutide (VICTOZA) 18 MG/3ML SOPN Inject 7.2 mg into the skin daily. Reported on 04/22/2016    Historical Provider, MD  losartan (COZAAR) 50 MG tablet Take 50 mg by mouth daily.    Historical Provider, MD  magnesium oxide (MAG-OX) 400 MG tablet Take 400 mg by mouth 2 (two) times daily.    Historical Provider, MD  metFORMIN (GLUCOPHAGE) 850 MG tablet Take 850 mg by mouth 3 (three) times daily.    Historical Provider, MD  metoprolol (LOPRESSOR) 50 MG tablet Take 50 mg by mouth daily.    Historical Provider, MD  Multiple Vitamins-Minerals (CENTRUM SILVER PO) Take 1 tablet by mouth daily.    Historical Provider, MD  Omega-3 Fatty Acids (FISH OIL) 1200 MG CAPS Take by mouth daily. PM    Historical Provider, MD  Potassium Gluconate 2.5 MEQ TABS Take by mouth.    Historical Provider, MD  pramipexole (MIRAPEX) 0.5 MG tablet Take 0.5 mg by mouth 2 (two) times daily. 2 tabs in afternoon, 2 tabs at night    Historical Provider, MD  traZODone (DESYREL) 100 MG tablet Take 100 mg by mouth at bedtime.    Historical Provider, MD    Allergies Levaquin [levofloxacin in d5w]; Lyrica [pregabalin]; Neurontin [gabapentin]; and Penicillins  Family History  Problem Relation Age of Onset  . Stroke Neg Hx   . Seizures Neg Hx   . Neuropathy Neg Hx   . Parkinsonism Neg Hx   . Migraines Neg Hx   . Ataxia Neg Hx   . Dementia Neg Hx     Social History Social History  Substance Use Topics  . Smoking status: Never Smoker  . Smokeless tobacco: Never Used  . Alcohol use No    Review of Systems  Constitutional: No  fever/chills. Eyes: No visual changes. ENT: No sore throat. Cardiovascular: Denies chest pain. Respiratory: Denies shortness of breath. Gastrointestinal: Positive for abdominal pain.  Positive for nausea, no vomiting.  Positive for diarrhea.  No constipation. Genitourinary: Negative for dysuria. Musculoskeletal: Negative for back pain. Skin: Negative for rash. Neurological: Positive for dizziness and unsteady gait. Negative for headaches, focal weakness or numbness.   ____________________________________________   PHYSICAL EXAM:  VITAL SIGNS: ED Triage Vitals  Enc Vitals Group     BP 03/29/17 2206 (!) 163/66     Pulse Rate 03/29/17 2206 77     Resp 03/29/17 2206 20     Temp 03/29/17 2203 98.4 F (36.9 C)     Temp Source 03/29/17 2203 Oral  SpO2 03/29/17 2206 96 %     Weight 03/29/17 2200 180 lb (81.6 kg)     Height 03/29/17 2200 5\' 1"  (1.549 m)     Head Circumference --      Peak Flow --      Pain Score 03/29/17 2200 8     Pain Loc --      Pain Edu? --      Excl. in [place]? --     Constitutional: Alert and oriented. Well appearing and in no acute distress. Eyes: Conjunctivae are normal. PERRL. EOMI. Head: Atraumatic. Nose: No congestion/rhinnorhea. Mouth/Throat: Mucous membranes are moist.  Oropharynx non-erythematous. Neck: No stridor.  No carotid bruits. Cardiovascular: Normal rate, regular rhythm. Grossly normal heart sounds.  Good peripheral circulation. Respiratory: Normal respiratory effort.  No retractions. Lungs CTAB. Gastrointestinal: Soft and nontender. No distention. No abdominal bruits. No CVA tenderness. Musculoskeletal: No lower extremity tenderness nor edema.  No joint effusions. Neurologic:  Alert and oriented 3. Normal speech and language. CN II-XII grossly intact. No gross focal neurologic deficits are appreciated. MAEx4. No gross cerebellar dysfunction. Skin:  Skin is warm, dry and intact. No rash noted. Psychiatric: Mood and affect are normal.  Speech and behavior are normal.  ____________________________________________   LABS (all labs ordered are listed, but only abnormal results are displayed)  Labs Reviewed  BASIC METABOLIC PANEL - Abnormal; Notable for the following:       Result Value   Glucose, Bld 113 (*)    All other components within normal limits  URINALYSIS, COMPLETE (UACMP) WITH MICROSCOPIC - Abnormal; Notable for the following:    Color, Urine STRAW (*)    APPearance CLEAR (*)    Leukocytes, UA MODERATE (*)    Squamous Epithelial / LPF 0-5 (*)    All other components within normal limits  CBC  TROPONIN I  TROPONIN I   ____________________________________________  EKG  ED ECG REPORT I, Helaman Mecca J, the attending physician, personally viewed and interpreted this ECG.   Date: 03/30/2017  EKG Time: 2205  Rate: 76  Rhythm: normal EKG, normal sinus rhythm  Axis: LAD  Intervals:none  ST&T Change: Nonspecific  ____________________________________________  RADIOLOGY  MRI interpreted per Dr. Jeannine Boga: 1. No acute intracranial process identified.  2. Mild for age chronic small vessel ischemic disease. Otherwise  normal brain MRI.   ____________________________________________   PROCEDURES  Procedure(s) performed: None  Procedures  Critical Care performed: No  ____________________________________________   INITIAL IMPRESSION / ASSESSMENT AND PLAN / ED COURSE  Pertinent labs & imaging results that were available during my care of the patient were reviewed by me and considered in my medical decision making (see chart for details).  76 year old female with a 1 year history of mild dizziness who presents secondary to worsening dizziness associated with unsteady gait. No focal neurological deficits at this time. Initial laboratory results including troponin are unremarkable. No abdominal tenderness on exam. Given patient's symptoms with worsening dizziness/ataxia, will proceed with MRI of the  brain to evaluate for cerebellar stroke.  Clinical Course as of Mar 30 329  Thu Mar 30, 2017  4098 Patient is feeling much better, smiling, resting comfortably. Updated her of negative MRI results. Will place patient on Septra for UTI. She has her appointment with ophthalmology this morning at [facility]. She will also follow up with her PCP closely. Strict return precautions given. Patient verbalizes understanding and agrees with plan of care.  [JS]    Clinical Course User Index [JS] Paulette Blanch,  MD     ____________________________________________   FINAL CLINICAL IMPRESSION(S) / ED DIAGNOSES  Final diagnoses:  Dizziness  Lower urinary tract infectious disease      NEW MEDICATIONS STARTED DURING THIS VISIT:  New Prescriptions   No medications on file     Note:  This document was prepared using Dragon voice recognition software and may include unintentional dictation errors.    Paulette Blanch, MD 03/30/17 [phone]

## 2017-03-30 NOTE — Discharge Instructions (Signed)
1. Take antibiotic as prescribed (Septra DS twice daily 7 days). 2. Return to the ER for worsening symptoms, persistent vomiting, difficulty breathing or other concerns.

## 2017-03-30 NOTE — ED Notes (Signed)
Patient transported to MRI 

## 2017-04-28 ENCOUNTER — Other Ambulatory Visit: Payer: Self-pay | Admitting: Internal Medicine

## 2017-04-28 DIAGNOSIS — Z1231 Encounter for screening mammogram for malignant neoplasm of breast: Secondary | ICD-10-CM

## 2017-05-03 ENCOUNTER — Ambulatory Visit
Admission: RE | Admit: 2017-05-03 | Discharge: 2017-05-03 | Disposition: A | Discharge status: Home / Self Care | Payer: Medicare Other | Source: Ambulatory Visit | Attending: Internal Medicine | Admitting: Internal Medicine

## 2017-05-03 DIAGNOSIS — Z1231 Encounter for screening mammogram for malignant neoplasm of breast: Secondary | ICD-10-CM | POA: Diagnosis present

## 2017-05-03 DIAGNOSIS — R928 Other abnormal and inconclusive findings on diagnostic imaging of breast: Secondary | ICD-10-CM | POA: Diagnosis not present

## 2017-05-05 ENCOUNTER — Other Ambulatory Visit: Payer: Self-pay | Admitting: Internal Medicine

## 2017-05-05 DIAGNOSIS — R928 Other abnormal and inconclusive findings on diagnostic imaging of breast: Secondary | ICD-10-CM

## 2017-05-05 DIAGNOSIS — N6489 Other specified disorders of breast: Secondary | ICD-10-CM

## 2017-05-15 ENCOUNTER — Ambulatory Visit
Admission: RE | Admit: 2017-05-15 | Discharge: 2017-05-15 | Disposition: A | Discharge status: Home / Self Care | Payer: Medicare Other | Source: Ambulatory Visit | Attending: Internal Medicine | Admitting: Internal Medicine

## 2017-05-15 DIAGNOSIS — R928 Other abnormal and inconclusive findings on diagnostic imaging of breast: Secondary | ICD-10-CM

## 2017-05-15 DIAGNOSIS — N6489 Other specified disorders of breast: Secondary | ICD-10-CM | POA: Diagnosis present

## 2017-05-15 DIAGNOSIS — R921 Mammographic calcification found on diagnostic imaging of breast: Secondary | ICD-10-CM | POA: Diagnosis not present

## 2017-05-16 ENCOUNTER — Other Ambulatory Visit: Payer: Self-pay | Admitting: Internal Medicine

## 2017-05-16 DIAGNOSIS — R928 Other abnormal and inconclusive findings on diagnostic imaging of breast: Secondary | ICD-10-CM

## 2017-05-18 ENCOUNTER — Ambulatory Visit
Admission: RE | Admit: 2017-05-18 | Discharge: 2017-05-18 | Disposition: A | Discharge status: Home / Self Care | Payer: Medicare Other | Source: Ambulatory Visit | Attending: Internal Medicine | Admitting: Internal Medicine

## 2017-05-18 DIAGNOSIS — D0511 Intraductal carcinoma in situ of right breast: Secondary | ICD-10-CM | POA: Diagnosis not present

## 2017-05-18 DIAGNOSIS — R928 Other abnormal and inconclusive findings on diagnostic imaging of breast: Secondary | ICD-10-CM

## 2017-05-18 HISTORY — PX: BREAST BIOPSY: SHX20

## 2017-05-22 LAB — SURGICAL PATHOLOGY

## 2017-06-05 ENCOUNTER — Other Ambulatory Visit: Payer: Self-pay | Admitting: Internal Medicine

## 2017-06-05 DIAGNOSIS — G9349 Other encephalopathy: Secondary | ICD-10-CM

## 2017-06-09 ENCOUNTER — Ambulatory Visit
Admission: RE | Admit: 2017-06-09 | Discharge: 2017-06-09 | Disposition: A | Discharge status: Home / Self Care | Payer: Medicare Other | Source: Ambulatory Visit | Attending: Internal Medicine | Admitting: Internal Medicine

## 2017-06-09 DIAGNOSIS — G9349 Other encephalopathy: Secondary | ICD-10-CM

## 2017-06-09 MED ORDER — GADOBENATE DIMEGLUMINE 529 MG/ML IV SOLN
15.0000 mL | Freq: Once | INTRAVENOUS | Status: AC | PRN
  Administered 2017-06-09: 15 mL via INTRAVENOUS

## 2017-12-22 ENCOUNTER — Ambulatory Visit: Payer: Medicare Other | Admitting: Anesthesiology

## 2017-12-22 ENCOUNTER — Encounter
Admission: RE | Disposition: A | Discharge status: Home / Self Care | Payer: Self-pay | Source: Ambulatory Visit | Attending: Unknown Physician Specialty

## 2017-12-22 ENCOUNTER — Ambulatory Visit
Admission: RE | Admit: 2017-12-22 | Discharge: 2017-12-22 | Disposition: A | Discharge status: Home / Self Care | Payer: Medicare Other | Source: Ambulatory Visit | Attending: Unknown Physician Specialty | Admitting: Unknown Physician Specialty

## 2017-12-22 ENCOUNTER — Encounter: Payer: Self-pay | Admitting: *Deleted

## 2017-12-22 DIAGNOSIS — Z880 Allergy status to penicillin: Secondary | ICD-10-CM | POA: Diagnosis not present

## 2017-12-22 DIAGNOSIS — F419 Anxiety disorder, unspecified: Secondary | ICD-10-CM | POA: Diagnosis not present

## 2017-12-22 DIAGNOSIS — Z1211 Encounter for screening for malignant neoplasm of colon: Secondary | ICD-10-CM | POA: Insufficient documentation

## 2017-12-22 DIAGNOSIS — Z96653 Presence of artificial knee joint, bilateral: Secondary | ICD-10-CM | POA: Diagnosis not present

## 2017-12-22 DIAGNOSIS — Z9049 Acquired absence of other specified parts of digestive tract: Secondary | ICD-10-CM | POA: Diagnosis not present

## 2017-12-22 DIAGNOSIS — Z8371 Family history of colonic polyps: Secondary | ICD-10-CM | POA: Diagnosis present

## 2017-12-22 DIAGNOSIS — I10 Essential (primary) hypertension: Secondary | ICD-10-CM | POA: Diagnosis not present

## 2017-12-22 DIAGNOSIS — Z7982 Long term (current) use of aspirin: Secondary | ICD-10-CM | POA: Insufficient documentation

## 2017-12-22 DIAGNOSIS — Z961 Presence of intraocular lens: Secondary | ICD-10-CM | POA: Diagnosis not present

## 2017-12-22 DIAGNOSIS — D123 Benign neoplasm of transverse colon: Secondary | ICD-10-CM | POA: Diagnosis not present

## 2017-12-22 DIAGNOSIS — Z7984 Long term (current) use of oral hypoglycemic drugs: Secondary | ICD-10-CM | POA: Diagnosis not present

## 2017-12-22 DIAGNOSIS — K640 First degree hemorrhoids: Secondary | ICD-10-CM | POA: Insufficient documentation

## 2017-12-22 DIAGNOSIS — E119 Type 2 diabetes mellitus without complications: Secondary | ICD-10-CM | POA: Insufficient documentation

## 2017-12-22 DIAGNOSIS — Z79899 Other long term (current) drug therapy: Secondary | ICD-10-CM | POA: Insufficient documentation

## 2017-12-22 DIAGNOSIS — Z9841 Cataract extraction status, right eye: Secondary | ICD-10-CM | POA: Insufficient documentation

## 2017-12-22 DIAGNOSIS — Z9851 Tubal ligation status: Secondary | ICD-10-CM | POA: Diagnosis not present

## 2017-12-22 DIAGNOSIS — Z881 Allergy status to other antibiotic agents status: Secondary | ICD-10-CM | POA: Insufficient documentation

## 2017-12-22 DIAGNOSIS — F329 Major depressive disorder, single episode, unspecified: Secondary | ICD-10-CM | POA: Diagnosis not present

## 2017-12-22 DIAGNOSIS — Z9842 Cataract extraction status, left eye: Secondary | ICD-10-CM | POA: Diagnosis not present

## 2017-12-22 DIAGNOSIS — K219 Gastro-esophageal reflux disease without esophagitis: Secondary | ICD-10-CM | POA: Insufficient documentation

## 2017-12-22 DIAGNOSIS — G4730 Sleep apnea, unspecified: Secondary | ICD-10-CM | POA: Diagnosis not present

## 2017-12-22 DIAGNOSIS — G43909 Migraine, unspecified, not intractable, without status migrainosus: Secondary | ICD-10-CM | POA: Insufficient documentation

## 2017-12-22 DIAGNOSIS — Z9889 Other specified postprocedural states: Secondary | ICD-10-CM | POA: Insufficient documentation

## 2017-12-22 DIAGNOSIS — M50323 Other cervical disc degeneration at C6-C7 level: Secondary | ICD-10-CM | POA: Insufficient documentation

## 2017-12-22 DIAGNOSIS — Z888 Allergy status to other drugs, medicaments and biological substances status: Secondary | ICD-10-CM | POA: Insufficient documentation

## 2017-12-22 HISTORY — PX: COLONOSCOPY WITH PROPOFOL: SHX5780

## 2017-12-22 LAB — GLUCOSE, CAPILLARY: GLUCOSE-CAPILLARY: 173 mg/dL — AB (ref 65–99)

## 2017-12-22 SURGERY — COLONOSCOPY WITH PROPOFOL
Anesthesia: General

## 2017-12-22 MED ORDER — PROPOFOL 10 MG/ML IV BOLUS
INTRAVENOUS | Status: DC | PRN
  Administered 2017-12-22: 40 mg via INTRAVENOUS

## 2017-12-22 MED ORDER — SODIUM CHLORIDE 0.9 % IV SOLN
INTRAVENOUS | Status: DC
  Administered 2017-12-22: 1000 mL via INTRAVENOUS

## 2017-12-22 MED ORDER — LIDOCAINE HCL (CARDIAC) 20 MG/ML IV SOLN
INTRAVENOUS | Status: DC | PRN
  Administered 2017-12-22: 40 mg via INTRAVENOUS

## 2017-12-22 MED ORDER — SODIUM CHLORIDE 0.9 % IV SOLN: INTRAVENOUS | Status: DC

## 2017-12-22 MED ORDER — FENTANYL CITRATE (PF) 100 MCG/2ML IJ SOLN: INTRAMUSCULAR | Status: DC | PRN

## 2017-12-22 MED ORDER — PROPOFOL 500 MG/50ML IV EMUL
INTRAVENOUS | Status: DC | PRN
  Administered 2017-12-22: 180 ug/kg/min via INTRAVENOUS

## 2017-12-22 MED ORDER — PROPOFOL 500 MG/50ML IV EMUL
INTRAVENOUS | Status: AC
  Filled 2017-12-22: qty 50

## 2017-12-22 MED ORDER — PROPOFOL 500 MG/50ML IV EMUL: INTRAVENOUS | Status: DC | PRN

## 2017-12-22 MED ORDER — FENTANYL CITRATE (PF) 100 MCG/2ML IJ SOLN
INTRAMUSCULAR | Status: AC
  Filled 2017-12-22: qty 2

## 2017-12-22 MED ORDER — VANCOMYCIN HCL IN DEXTROSE 750-5 MG/150ML-% IV SOLN
750.0000 mg | Freq: Once | INTRAVENOUS | Status: AC
  Administered 2017-12-22: 750 mg via INTRAVENOUS
  Filled 2017-12-22: qty 150

## 2017-12-22 MED ORDER — FENTANYL CITRATE (PF) 100 MCG/2ML IJ SOLN
INTRAMUSCULAR | Status: DC | PRN
  Administered 2017-12-22: 50 ug via INTRAVENOUS

## 2017-12-22 MED ORDER — GENTAMICIN IN SALINE 1.6-0.9 MG/ML-% IV SOLN
80.0000 mg | Freq: Once | INTRAVENOUS | Status: AC
  Administered 2017-12-22: 80 mg via INTRAVENOUS
  Filled 2017-12-22: qty 50

## 2017-12-22 NOTE — Transfer of Care (Signed)
Immediate Anesthesia Transfer of Care Note  Patient: Allison Blackburn  Procedure(s) Performed: COLONOSCOPY WITH PROPOFOL (N/A )  Patient Location: PACU  Anesthesia Type:General  Level of Consciousness: awake  Airway & Oxygen Therapy: Patient Spontanous Breathing and Patient connected to nasal cannula oxygen  Post-op Assessment: Report given to RN and Post -op Vital signs reviewed and stable  Post vital signs: Reviewed and stable  Last Vitals:  Vitals:   12/22/17 0743  BP: (!) 143/64  Pulse: 78  Resp: 20  Temp: (!) 35.9 C  SpO2: 96%    Last Pain:  Vitals:   12/22/17 0743  TempSrc: Tympanic         Complications: No apparent anesthesia complications

## 2017-12-22 NOTE — Op Note (Signed)
[facility] Patient Name: Allison Blackburn Procedure Date: 12/22/2017 9:53 AM MRN: [id number] Account #: [id number] Date of Birth: 08/21/1941 Admit Type: Outpatient Age: 77 Room: [facility] ENDO ROOM 3 Gender: Female Note Status: Finalized Procedure:            Colonoscopy Indications:          Colon cancer screening in patient at increased risk:                        Family history of 1st-degree relative with colon polyps Providers:            Manya Silvas, MD Referring MD:         Rusty Aus, MD (Referring MD) Medicines:            Propofol per Anesthesia Complications:        No immediate complications. Procedure:            Pre-Anesthesia Assessment:                       - After reviewing the risks and benefits, the patient                        was deemed in satisfactory condition to undergo the                        procedure.                       After obtaining informed consent, the colonoscope was                        passed under direct vision. Throughout the procedure,                        the patient's blood pressure, pulse, and oxygen                        saturations were monitored continuously. The                        Colonoscope was introduced through the anus and                        advanced to the the cecum, identified by appendiceal                        orifice and ileocecal valve. The colonoscopy was                        performed without difficulty. The patient tolerated the                        procedure well. The quality of the bowel preparation                        was excellent. Findings:      A diminutive polyp was found in the transverse colon. The polyp was       sessile. The polyp was removed with a jumbo cold forceps. Resection and       retrieval were complete.  A small polyp was found in the transverse colon. The polyp was sessile.       The polyp was removed with a cold snare. Resection  and retrieval were       complete.      A small polyp was found in the transverse colon. The polyp was sessile.       The polyp was removed with a cold snare. Resection and retrieval were       complete.      A diminutive polyp was found in the transverse colon. The polyp was       sessile. The polyp was removed with a jumbo cold forceps. Resection and       retrieval were complete.      Internal hemorrhoids were found during endoscopy. The hemorrhoids were       small and Grade I (internal hemorrhoids that do not prolapse).      The exam was otherwise without abnormality. Impression:           - One diminutive polyp in the transverse colon, removed                        with a jumbo cold forceps. Resected and retrieved.                       - One small polyp in the transverse colon, removed with                        a cold snare. Resected and retrieved.                       - One small polyp in the transverse colon, removed with                        a cold snare. Resected and retrieved.                       - One diminutive polyp in the transverse colon, removed                        with a jumbo cold forceps. Resected and retrieved.                       - Internal hemorrhoids.                       - The examination was otherwise normal. Recommendation:       - Await pathology results. Manya Silvas, MD 12/22/2017 10:37:29 AM This report has been signed electronically. Number of Addenda: 0 Note Initiated On: 12/22/2017 9:53 AM Scope Withdrawal Time: 0 hours 14 minutes 58 seconds  Total Procedure Duration: 0 hours 26 minutes 50 seconds       [facility]

## 2017-12-22 NOTE — Anesthesia Preprocedure Evaluation (Signed)
Anesthesia Evaluation  Patient identified by MRN, date of birth, ID band Patient awake    Reviewed: Allergy & Precautions, NPO status , Patient's Chart, lab work & pertinent test results, reviewed documented beta blocker date and time   History of Anesthesia Complications Negative for: history of anesthetic complications  Airway Mallampati: II       Dental   Pulmonary sleep apnea and Continuous Positive Airway Pressure Ventilation , neg COPD,           Cardiovascular hypertension, Pt. on medications and Pt. on home beta blockers (-) Past MI and (-) CHF (-) dysrhythmias (-) Valvular Problems/Murmurs     Neuro/Psych neg Seizures Anxiety Depression    GI/Hepatic Neg liver ROS, GERD  Medicated and Controlled,  Endo/Other  diabetes, Type 2, Oral Hypoglycemic Agents  Renal/GU negative Renal ROS     Musculoskeletal   Abdominal   Peds  Hematology   Anesthesia Other Findings   Reproductive/Obstetrics                             Anesthesia Physical Anesthesia Plan  ASA: III  Anesthesia Plan: General   Post-op Pain Management:    Induction: Intravenous  PONV Risk Score and Plan: 3 and TIVA, Propofol infusion and Ondansetron  Airway Management Planned: Nasal Cannula  Additional Equipment:   Intra-op Plan:   Post-operative Plan:   Informed Consent: I have reviewed the patients History and Physical, chart, labs and discussed the procedure including the risks, benefits and alternatives for the proposed anesthesia with the patient or authorized representative who has indicated his/her understanding and acceptance.     Plan Discussed with:   Anesthesia Plan Comments:         Anesthesia Quick Evaluation

## 2017-12-22 NOTE — H&P (Signed)
Primary Care Physician:  Rusty Aus, MD Primary Gastroenterologist:  Dr. Vira Agar  Pre-Procedure History & Physical: HPI:  Allison Blackburn is a 77 y.o. female is here for an colonoscopy.   Past Medical History:  Diagnosis Date  . Anxiety   . Arthritis   . Degenerative disc disease, cervical    C5-C7  . Depression   . Diabetes mellitus without complication ([place])   . GERD (gastroesophageal reflux disease)    in past.  corrected with [employer].  Marland Kitchen Headache    migraines in past.  now some from C5-C7 disc degeneration  . Hypertension   . Motion sickness    cars/[place]ains  . Sleep apnea    CPAP  . Sodium (Na) deficiency    had "drip" during knee surg.  Sodium levels very low for extended time after.  OK now  . Stiff neck    degen disc C5-C7.  Limited right/left turn.  OK up/down motion.  . Wears hearing aid     Past Surgical History:  Procedure Laterality Date  . BREAST BIOPSY Right 05/18/2017   stereo bx path pending  . CARPAL TUNNEL RELEASE    . CATARACT EXTRACTION W/PHACO Left 02/17/2016   Procedure: CATARACT EXTRACTION PHACO AND INTRAOCULAR LENS PLACEMENT (IOC);  Surgeon: Leandrew Koyanagi, MD;  Location: [place];  Service: Ophthalmology;  Laterality: Left;  DIABETIC - oral meds CPAP  . CATARACT EXTRACTION W/PHACO Right 04/06/2016   Procedure: CATARACT EXTRACTION PHACO AND INTRAOCULAR LENS PLACEMENT ([place]) right eye;  Surgeon: Leandrew Koyanagi, MD;  Location: [place];  Service: Ophthalmology;  Laterality: Right;  DIABETIC - oral meds CPAP  . CHOLECYSTECTOMY    . NISSEN FUNDOPLICATION  7209  . REPLACEMENT TOTAL KNEE BILATERAL    . REVISION TOTAL KNEE ARTHROPLASTY Bilateral    after fall  . SHOULDER SURGERY Right    x2  . TUBAL LIGATION    . WRIST SURGERY Right 02/02/16   [facility]    Prior to Admission medications   Medication Sig Start Date End Date Taking? Authorizing Provider  metoprolol (LOPRESSOR) 50 MG tablet Take 50 mg by mouth daily.    Yes [provider]  amLODipine (NORVASC) 10 MG tablet Take 10 mg by mouth daily. Reported on 04/22/2016    [provider]  aspirin 81 MG tablet Take 81 mg by mouth daily.    [provider]  atorvastatin (LIPITOR) 10 MG tablet Take 10 mg by mouth 3 (three) times a week.    [provider]  Calcium Citrate-Vitamin D (CALCIUM CITRATE + PO) Take 2 tablets by mouth daily.    [provider]  celecoxib (CELEBREX) 200 MG capsule Take 200 mg by mouth daily. AM    [provider]  Cholecalciferol (VITAMIN D-3) 1000 units CAPS Take by mouth.    [provider]  clonazePAM (KLONOPIN) 0.5 MG tablet Take 1 mg by mouth at bedtime.    [provider]  DULoxetine (CYMBALTA) 30 MG capsule Take 30 mg by mouth daily.    [provider]  glimepiride (AMARYL) 1 MG tablet Take 1 mg by mouth QID. 1 tab, 3 times daily.  2 tabs at night (if sugar is high).    [provider]  Liraglutide (VICTOZA) 18 MG/3ML SOPN Inject 7.2 mg into the skin daily. Reported on 04/22/2016    [provider]  losartan (COZAAR) 50 MG tablet Take 50 mg by mouth daily.    [provider]  magnesium oxide (MAG-OX) 400 MG tablet Take 400 mg by mouth 2 (two) times daily.    [provider]  metFORMIN (GLUCOPHAGE) 850 MG tablet Take 850 mg by mouth 3 (three) times daily.    [provider]  Omega-3 Fatty Acids (FISH OIL) 1200 MG CAPS Take by mouth daily. PM    [provider]  Potassium Gluconate 2.5 MEQ TABS Take by mouth.    [provider]  pramipexole (MIRAPEX) 0.5 MG tablet Take 0.5 mg by mouth 2 (two) times daily. 2 tabs in afternoon, 2 tabs at night    [provider]  traZODone (DESYREL) 100 MG tablet Take 100 mg by mouth at bedtime.    [provider]    Allergies as of 10/23/2017 - Review Complete 03/30/2017  Allergen Reaction Noted  . Levaquin [levofloxacin in d5w] Other (See  Comments) 04/25/2015  . Lyrica [pregabalin]  02/12/2016  . Neurontin [gabapentin]  02/12/2016  . Penicillins Rash 04/25/2015    Family History  Problem Relation Age of Onset  . Stroke Neg Hx   . Seizures Neg Hx   . Neuropathy Neg Hx   . Parkinsonism Neg Hx   . Migraines Neg Hx   . Ataxia Neg Hx   . Dementia Neg Hx     Social History   Socioeconomic History  . Marital status: Married    Spouse name: Not on file  . Number of children: 2  . Years of education: 42  . Highest education level: Not on file  Social Needs  . Financial resource strain: Not on file  . Food insecurity - worry: Not on file  . Food insecurity - inability: Not on file  . Transportation needs - medical: Not on file  . Transportation needs - non-medical: Not on file  Occupational History  . Not on file  Tobacco Use  . Smoking status: Never Smoker  . Smokeless tobacco: Never Used  Substance and Sexual Activity  . Alcohol use: No  . Drug use: Not on file  . Sexual activity: Not on file  Other Topics Concern  . Not on file  Social History Narrative   Lives with husband.   Caffeine use:  2 cups coffee per day    Review of Systems: See HPI, otherwise negative ROS  Physical Exam: BP (!) 143/64   Pulse 78   Temp (!) 96.6 F (35.9 C) (Tympanic)   Resp 20   Ht 5' (1.524 m)   Wt 85.3 kg (188 lb)   SpO2 96%   BMI 36.72 kg/m  General:   Alert,  pleasant and cooperative in NAD Head:  Normocephalic and atraumatic. Neck:  Supple; no masses or thyromegaly. Lungs:  Clear throughout to auscultation.    Heart:  Regular rate and rhythm. Abdomen:  Soft, nontender and nondistended. Normal bowel sounds, without guarding, and without rebound.   Neurologic:  Alert and  oriented x4;  grossly normal neurologically.  Impression/Plan: [name] is here for an colonoscopy to be performed for FH colon polyps.  Risks, benefits, limitations, and alternatives regarding  colonoscopy have been reviewed with  the patient.  Questions have been answered.  All parties agreeable.   Gaylyn Cheers, MD  12/22/2017, 9:57 AM

## 2017-12-22 NOTE — Anesthesia Procedure Notes (Signed)
Date/Time: 12/22/2017 10:00 AM Performed by: Allean Found, CRNA Pre-anesthesia Checklist: Patient identified, Emergency Drugs available, Suction available, Patient being monitored and Timeout performed Oxygen Delivery Method: Nasal cannula Induction Type: IV induction Placement Confirmation: positive ETCO2 Dental Injury: Teeth and Oropharynx as per pre-operative assessment

## 2017-12-22 NOTE — Anesthesia Post-op Follow-up Note (Signed)
Anesthesia QCDR form completed.        

## 2017-12-22 NOTE — Anesthesia Postprocedure Evaluation (Signed)
Anesthesia Post Note  Patient: Allison Blackburn  Procedure(s) Performed: COLONOSCOPY WITH PROPOFOL (N/A )  Patient location during evaluation: Endoscopy Anesthesia Type: General Level of consciousness: awake and alert Pain management: pain level controlled Vital Signs Assessment: post-procedure vital signs reviewed and stable Respiratory status: spontaneous breathing and respiratory function stable Cardiovascular status: stable Anesthetic complications: no     Last Vitals:  Vitals:   12/22/17 0743 12/22/17 1040  BP: (!) 143/64 (!) 115/52  Pulse: 78 66  Resp: 20 20  Temp: (!) 35.9 C 36.5 C  SpO2: 96% 100%    Last Pain:  Vitals:   12/22/17 1040  TempSrc: Tympanic                 KEPHART,WILLIAM K

## 2017-12-25 ENCOUNTER — Encounter: Payer: Self-pay | Admitting: Unknown Physician Specialty

## 2017-12-25 LAB — SURGICAL PATHOLOGY

## 2019-04-05 IMAGING — MG MM BREAST BX W/ LOC DEV 1ST LESION IMAGE BX SPEC STEREO GUIDE*R*
8 of 13 series · 8 of 29 positions shown · non-contrast
Comparison: Previous exams.

ADDENDUM:
Pathology of the right breast biopsy revealed A. BREAST, RIGHT,
MEDIAL; STEREOTACTIC-GUIDED CORE BIOPSY:- DUCTAL CARCINOMA IN SITU
(DCIS), HIGH NUCLEAR GRADE WITH COMEDONECROSIS, ASSOCIATED WITH
CALCIFICATIONS. Comment: DCIS is identified in all blocks and spans
up to 2 mm. This specimen is negative for invasive carcinoma. The
diagnosis was called to Eladio Edmund in the [facility] on 05/19/17 at [DATE]. Read-back was performed. This was
found to be concordant by Dr. Bakke.

Recommendation: Surgical excision. Please note - the biopsy clip in
migrated 2 cm inferior to the biopsy site. There are remaining
calcifications at the biopsy site. Please see post biopsy clip film
report of 05/18/17.
At the request of the patient and her husband at the time of biopsy,
results and recommendations were to be relayed to the husband.
Results became available after [DATE] on 05/19/17 after Dr. [REDACTED] hours. Dr. Forevar nurse was notified of the results on
05/22/17 by phone. Results will be given by Dr. Forevar and his office
will arrange the referral. The patient's husband was contacted on
05/23/17 by Embezi for a post biopsy check and to confirm
that results had been given to the patient and her husband. He
stated that his wife had done well following the biopsy with no
bleeding, bruising, or hematoma. He stated that the results had been
given to them by Dr. Matz, [place]. All of his questions were answered. He
was encouraged to call the [HOSPITAL] with any further
questions or concerns.
Addendum by Embezi on 05/23/17.
CLINICAL DATA: Right breast asymmetry with calcifications.  Biopsy.
EXAM:
RIGHT BREAST STEREOTACTIC CORE NEEDLE BIOPSY

[R (1 of 6)]
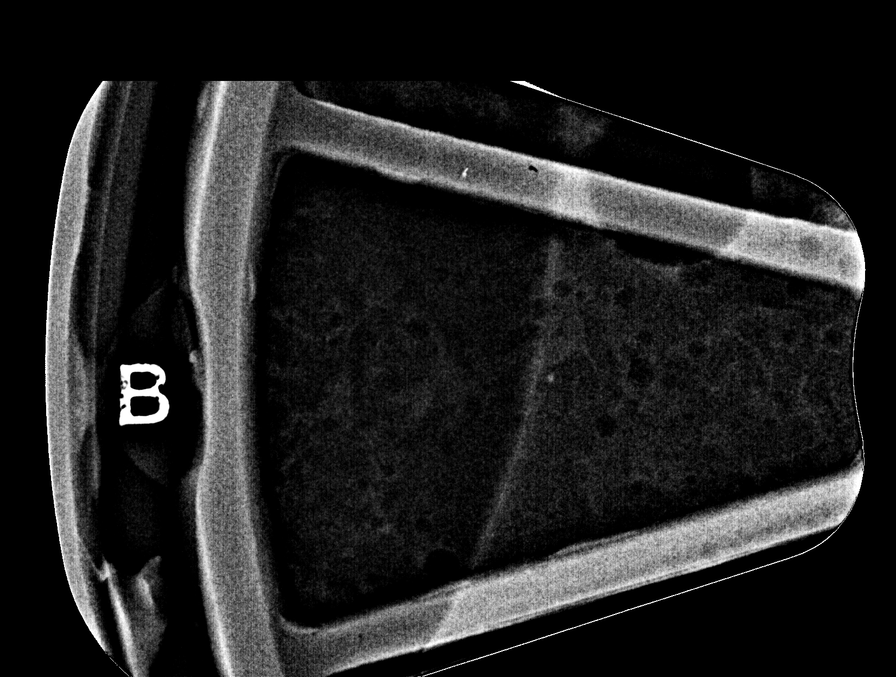

[R (2 of 6)]
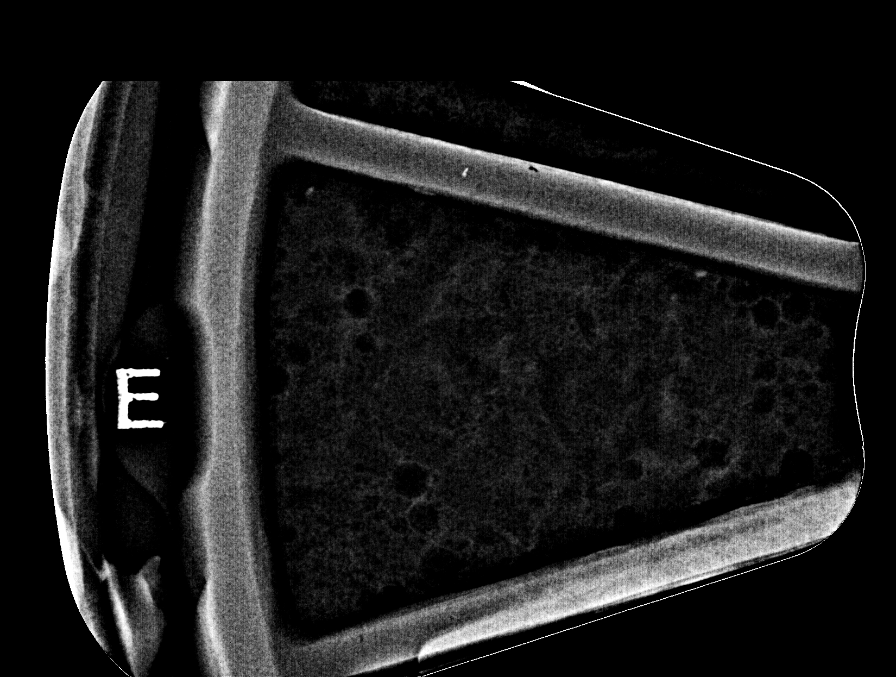

[R (3 of 6)]
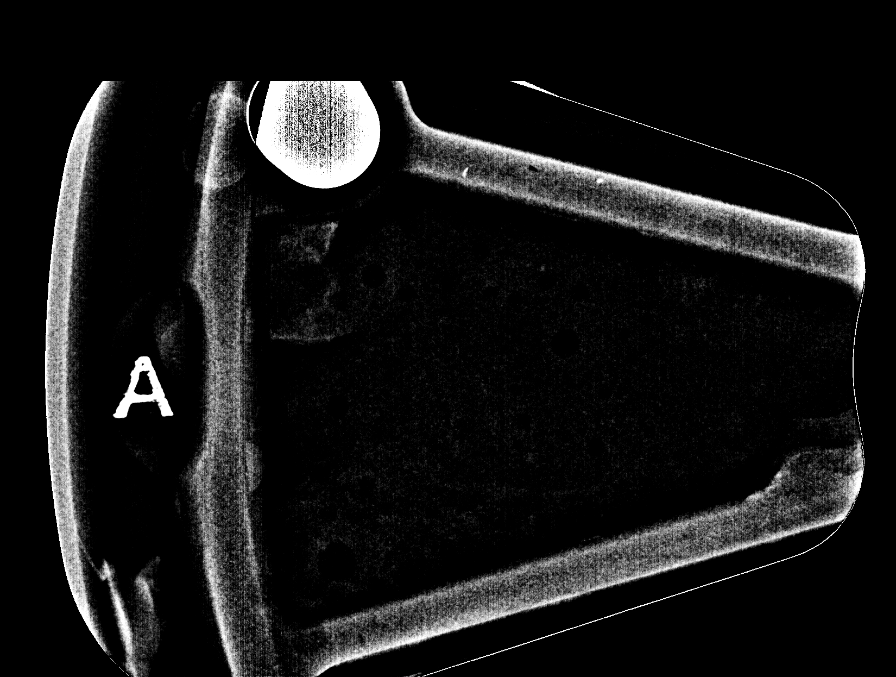

[R (4 of 6)]
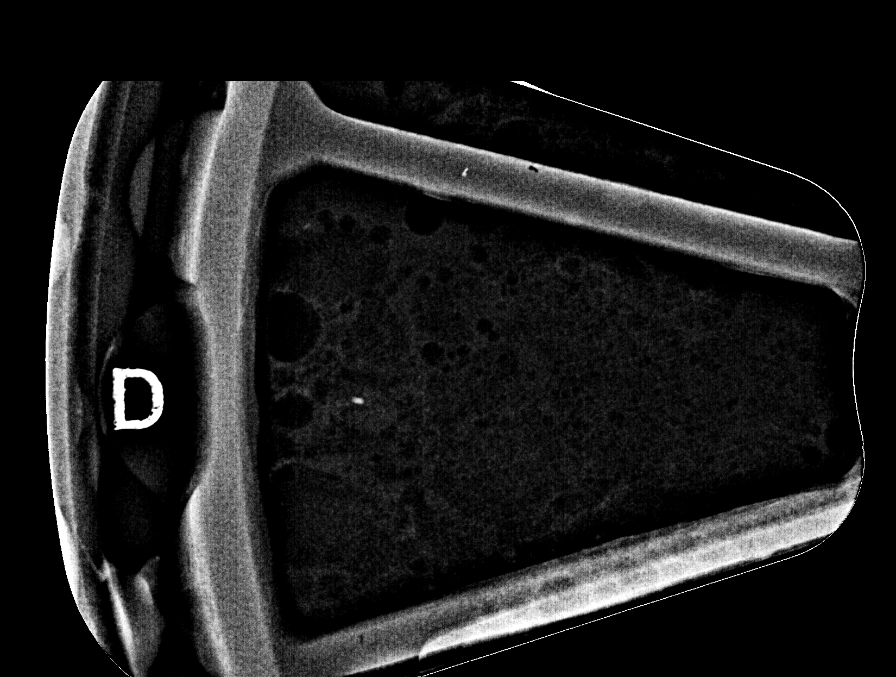

[R (5 of 6)]
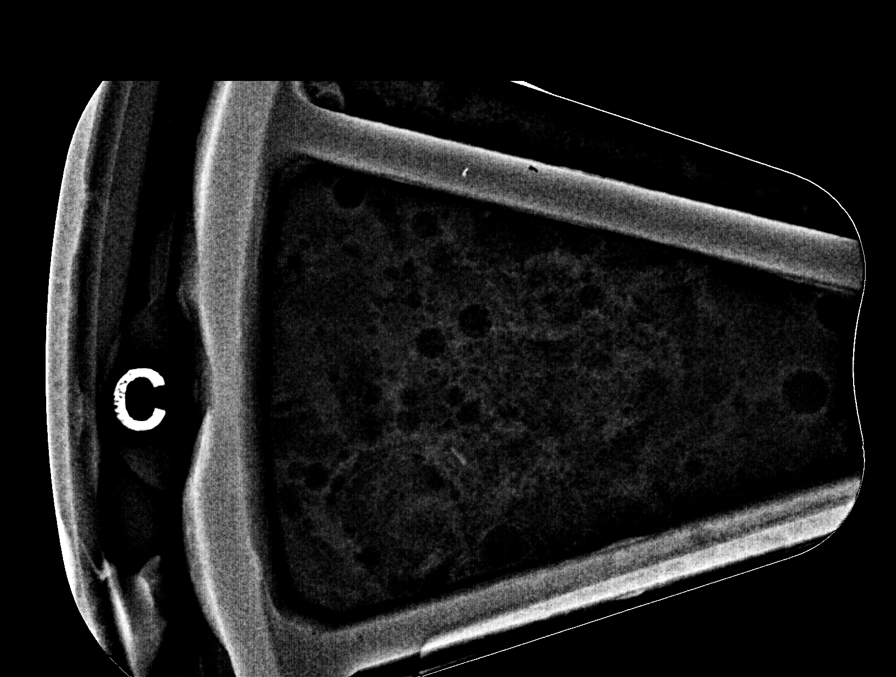

[R (6 of 6)]
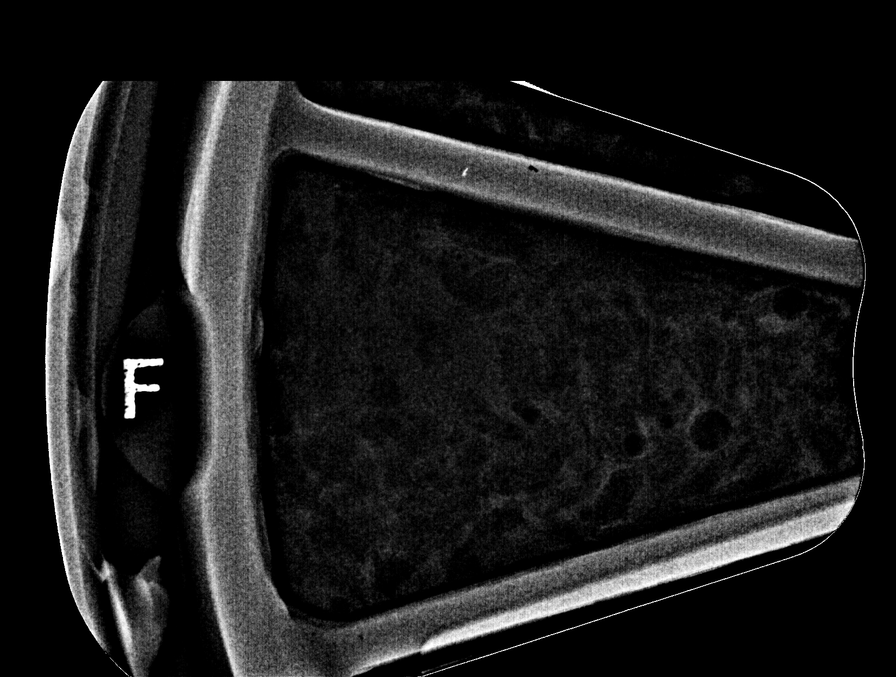

[R CC (1 of 2)]
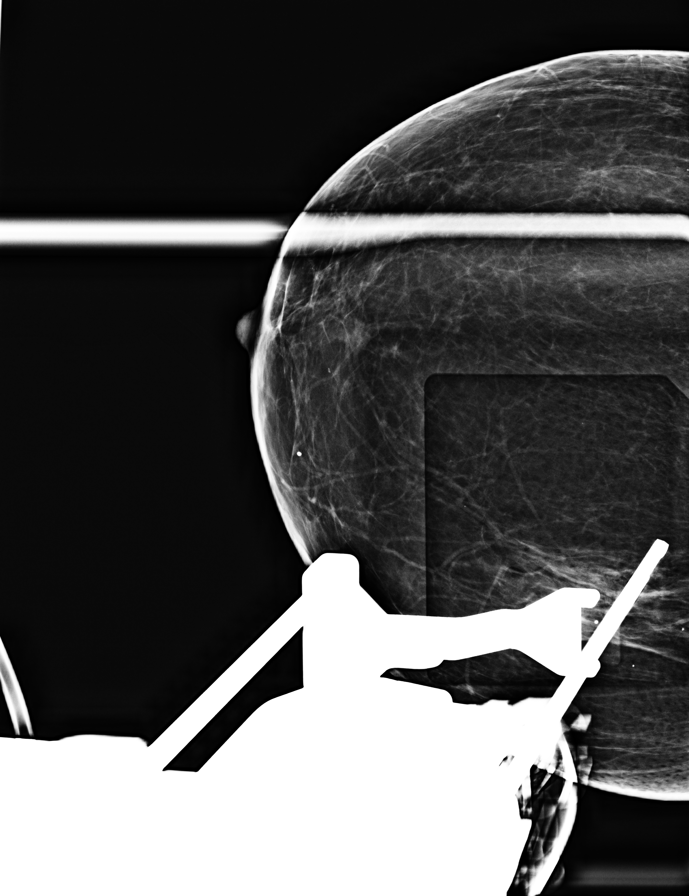

[R CC (2 of 2)]
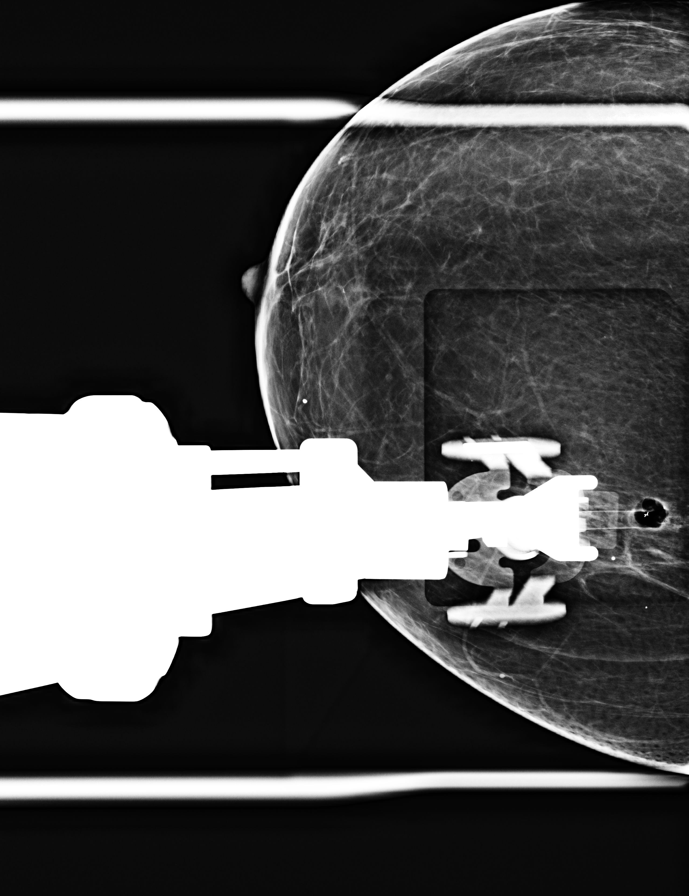

[8 of 29 positions shown; findings below may reference images not displayed]



Using sterile technique and 1% Lidocaine as local anesthetic, under
stereotactic guidance, a 9 gauge vacuum assisted device was used to
perform core needle biopsy of calcifications in the medial superior
right breast using a superior approach. Specimen radiograph was
performed showing calcifications in 5 core specimens. Specimens with
calcifications are identified for pathology.

Lesion quadrant: Upper inner

At the conclusion of the procedure, a tissue marker clip was
deployed into the biopsy cavity. Follow-up 2-view mammogram was
performed and dictated separately.
IMPRESSION: Stereotactic-guided biopsy of of a right breast asymmetry with
calcifications. No apparent complications.
